# Patient Record
Sex: Female | Born: 1950 | Race: White | Hispanic: No | Smoking: Never smoker
Health system: Southern US, Community
[De-identification: ages and names within clinical notes are randomized; demographics above are authoritative.]

## PROBLEM LIST (undated history)

## (undated) DIAGNOSIS — K222 Esophageal obstruction: Secondary | ICD-10-CM

## (undated) DIAGNOSIS — R1310 Dysphagia, unspecified: Secondary | ICD-10-CM

## (undated) DIAGNOSIS — J45909 Unspecified asthma, uncomplicated: Secondary | ICD-10-CM

## (undated) DIAGNOSIS — J309 Allergic rhinitis, unspecified: Secondary | ICD-10-CM

## (undated) DIAGNOSIS — R41841 Cognitive communication deficit: Secondary | ICD-10-CM

## (undated) DIAGNOSIS — R569 Unspecified convulsions: Secondary | ICD-10-CM

## (undated) HISTORY — DX: Allergic rhinitis, unspecified: J30.9

## (undated) HISTORY — DX: Unspecified asthma, uncomplicated: J45.909

## (undated) HISTORY — PX: CHOLECYSTECTOMY: SHX55

---

## 2018-06-23 DIAGNOSIS — G40909 Epilepsy, unspecified, not intractable, without status epilepticus: Secondary | ICD-10-CM

## 2020-04-25 ENCOUNTER — Emergency Department (HOSPITAL_COMMUNITY): Payer: Medicare Other | Admitting: Anesthesiology

## 2020-04-25 ENCOUNTER — Encounter (HOSPITAL_COMMUNITY)
Admission: EM | Disposition: A | Discharge status: Home / Self Care | Payer: Self-pay | Source: Home / Self Care | Attending: Emergency Medicine

## 2020-04-25 ENCOUNTER — Other Ambulatory Visit: Payer: Self-pay

## 2020-04-25 ENCOUNTER — Emergency Department (HOSPITAL_COMMUNITY)
Admission: EM | Admit: 2020-04-25 | Discharge: 2020-04-25 | Disposition: A | Discharge status: Home / Self Care | Payer: Medicare Other | Attending: Gastroenterology | Admitting: Gastroenterology

## 2020-04-25 ENCOUNTER — Encounter (HOSPITAL_COMMUNITY): Payer: Self-pay

## 2020-04-25 DIAGNOSIS — K219 Gastro-esophageal reflux disease without esophagitis: Secondary | ICD-10-CM | POA: Insufficient documentation

## 2020-04-25 DIAGNOSIS — K222 Esophageal obstruction: Secondary | ICD-10-CM | POA: Diagnosis not present

## 2020-04-25 DIAGNOSIS — Z7951 Long term (current) use of inhaled steroids: Secondary | ICD-10-CM | POA: Diagnosis not present

## 2020-04-25 DIAGNOSIS — T18128A Food in esophagus causing other injury, initial encounter: Secondary | ICD-10-CM | POA: Diagnosis not present

## 2020-04-25 DIAGNOSIS — K2210 Ulcer of esophagus without bleeding: Secondary | ICD-10-CM | POA: Diagnosis not present

## 2020-04-25 DIAGNOSIS — R41841 Cognitive communication deficit: Secondary | ICD-10-CM | POA: Insufficient documentation

## 2020-04-25 DIAGNOSIS — X58XXXA Exposure to other specified factors, initial encounter: Secondary | ICD-10-CM | POA: Insufficient documentation

## 2020-04-25 DIAGNOSIS — Z885 Allergy status to narcotic agent status: Secondary | ICD-10-CM | POA: Diagnosis not present

## 2020-04-25 DIAGNOSIS — Z20822 Contact with and (suspected) exposure to covid-19: Secondary | ICD-10-CM | POA: Insufficient documentation

## 2020-04-25 DIAGNOSIS — R1310 Dysphagia, unspecified: Secondary | ICD-10-CM | POA: Diagnosis present

## 2020-04-25 DIAGNOSIS — Z79899 Other long term (current) drug therapy: Secondary | ICD-10-CM | POA: Insufficient documentation

## 2020-04-25 HISTORY — PX: ESOPHAGOGASTRODUODENOSCOPY (EGD) WITH PROPOFOL: SHX5813

## 2020-04-25 HISTORY — PX: FOREIGN BODY REMOVAL: SHX962

## 2020-04-25 LAB — COMPREHENSIVE METABOLIC PANEL
ALT: 19 U/L (ref 0–44)
AST: 20 U/L (ref 15–41)
Albumin: 4.1 g/dL (ref 3.5–5.0)
Alkaline Phosphatase: 134 U/L — ABNORMAL HIGH (ref 38–126)
Anion gap: 12 (ref 5–15)
BUN: 14 mg/dL (ref 8–23)
CO2: 27 mmol/L (ref 22–32)
Calcium: 9.3 mg/dL (ref 8.9–10.3)
Chloride: 99 mmol/L (ref 98–111)
Creatinine, Ser: 0.7 mg/dL (ref 0.44–1.00)
GFR, Estimated: >60 mL/min (ref >60)
Glucose, Bld: 103 mg/dL — ABNORMAL HIGH (ref 70–99)
Potassium: 3.4 mmol/L — ABNORMAL LOW (ref 3.5–5.1)
Sodium: 138 mmol/L (ref 135–145)
Total Bilirubin: 0.8 mg/dL (ref 0.3–1.2)
Total Protein: 8.3 g/dL — ABNORMAL HIGH (ref 6.5–8.1)

## 2020-04-25 LAB — CBC WITH DIFFERENTIAL/PLATELET
Abs Immature Granulocytes: 0.04 10*3/uL (ref 0.00–0.07)
Basophils Absolute: 0.1 10*3/uL (ref 0.0–0.1)
Basophils Relative: 1 %
Eosinophils Absolute: 0.1 10*3/uL (ref 0.0–0.5)
Eosinophils Relative: 2 %
HCT: 41.4 % (ref 36.0–46.0)
Hemoglobin: 13.8 g/dL (ref 12.0–15.0)
Immature Granulocytes: 1 %
Lymphocytes Relative: 18 %
Lymphs Abs: 1.6 10*3/uL (ref 0.7–4.0)
MCH: 31.6 pg (ref 26.0–34.0)
MCHC: 33.3 g/dL (ref 30.0–36.0)
MCV: 94.7 fL (ref 80.0–100.0)
Monocytes Absolute: 0.7 10*3/uL (ref 0.1–1.0)
Monocytes Relative: 9 %
Neutro Abs: 6.1 10*3/uL (ref 1.7–7.7)
Neutrophils Relative %: 69 %
Platelets: 297 10*3/uL (ref 150–400)
RBC: 4.37 MIL/uL (ref 3.87–5.11)
RDW: 14.3 % (ref 11.5–15.5)
WBC: 8.6 10*3/uL (ref 4.0–10.5)
nRBC: 0 % (ref 0.0–0.2)

## 2020-04-25 LAB — POC SARS CORONAVIRUS 2 AG: SARS Coronavirus 2 Ag: NEGATIVE

## 2020-04-25 SURGERY — ESOPHAGOGASTRODUODENOSCOPY (EGD) WITH PROPOFOL
Anesthesia: General

## 2020-04-25 MED ORDER — SUCCINYLCHOLINE CHLORIDE 200 MG/10ML IV SOSY
PREFILLED_SYRINGE | INTRAVENOUS | Status: DC | PRN
  Administered 2020-04-25: 140 mg via INTRAVENOUS

## 2020-04-25 MED ORDER — PROPOFOL 10 MG/ML IV BOLUS
INTRAVENOUS | Status: DC | PRN
  Administered 2020-04-25: 180 mg via INTRAVENOUS

## 2020-04-25 MED ORDER — SODIUM CHLORIDE 0.9 % IV BOLUS
500.0000 mL | Freq: Once | INTRAVENOUS | Status: AC
  Administered 2020-04-25: 500 mL via INTRAVENOUS

## 2020-04-25 MED ORDER — LACTATED RINGERS IV SOLN: INTRAVENOUS | Status: DC | PRN

## 2020-04-25 MED ORDER — FENTANYL CITRATE (PF) 100 MCG/2ML IJ SOLN
INTRAMUSCULAR | Status: AC
  Filled 2020-04-25: qty 2

## 2020-04-25 MED ORDER — PANTOPRAZOLE SODIUM 40 MG PO TBEC: 40.0000 mg | DELAYED_RELEASE_TABLET | Freq: Two times a day (BID) | ORAL | 0 refills | Status: DC

## 2020-04-25 MED ORDER — LIDOCAINE 2% (20 MG/ML) 5 ML SYRINGE
INTRAMUSCULAR | Status: DC | PRN
  Administered 2020-04-25: 60 mg via INTRAVENOUS

## 2020-04-25 MED ORDER — SODIUM CHLORIDE 0.9 % IV SOLN: INTRAVENOUS | Status: DC

## 2020-04-25 MED ORDER — FENTANYL CITRATE (PF) 100 MCG/2ML IJ SOLN
INTRAMUSCULAR | Status: DC | PRN
  Administered 2020-04-25: 50 ug via INTRAVENOUS

## 2020-04-25 MED ORDER — PROPOFOL 10 MG/ML IV BOLUS
INTRAVENOUS | Status: AC
  Filled 2020-04-25: qty 20

## 2020-04-25 MED ORDER — ACETAMINOPHEN 325 MG PO TABS
650.0000 mg | ORAL_TABLET | Freq: Once | ORAL | Status: AC
  Administered 2020-04-25: 650 mg via ORAL
  Filled 2020-04-25: qty 2

## 2020-04-25 SURGICAL SUPPLY — 14 items

## 2020-04-25 NOTE — Brief Op Note (Signed)
04/25/2020  3:20 PM  PATIENT:  Erica Griffin  70 y.o. female  PRE-OPERATIVE DIAGNOSIS:  food impaction  POST-OPERATIVE DIAGNOSIS:  esophageal ulcer, food bolus  PROCEDURE:  Procedure(s): ESOPHAGOGASTRODUODENOSCOPY (EGD) WITH PROPOFOL (N/A)  SURGEON:  Surgeon(s) and Role:    * Gerad Cornelio, MD - Primary  Findings --------- -Retained pill and small amount of retained food at GE junction. Was removed easily by pushing down into the stomach. There was a large circumferential ulcer at the distal esophagus probably from retained pill for last 2 days. There was also lower esophageal stenosis but I was able to advance scope without any resistance.  Recommendations ------------------------- -Findings discussed with the ED physician -Recommend Protonix 40 mg twice a day for next 2 months and then Protonix 40 mg once a day -She may benefit from repeat EGD in 2 to 3 months to document healing of distal esophageal ulcer  -Avoid NSAIDs -Recommend soft diet. She needs to chew food well and moist food before swallowing -Okay to discharge from GI standpoint. GI will sign off.  Kathi Der MD, FACP 04/25/2020, 3:22 PM  Contact #  [phone]

## 2020-04-25 NOTE — Discharge Instructions (Signed)
-  Recommend Protonix 40 mg twice a day for next 2 months and then Protonix 40 mg once a day.  Prescription given for Protonix, will need maintenance prescription from PCP. -She may benefit from repeat EGD in 2 to 3 months to document healing of distal esophageal ulcer  -Avoid NSAIDs -Recommend soft diet. She needs to chew food well and moist food before swallowing

## 2020-04-25 NOTE — Op Note (Addendum)
[facility] Patient Name: Erica Griffin Procedure Date: 04/25/2020 MRN: [id number] Attending MD: Kathi Der , MD Date of Birth: 05-Feb-1951 CSN: [id number] Age: 70 Admit Type: Emergency Department Procedure:                Upper GI endoscopy Indications:              Dysphagia, Foreign body in the esophagus Providers:                Kathi Der, MD, Clearnce Sorrel, RN, Rosilyn Mings, Technician Referring MD:              Medicines:                Sedation Administered by an Anesthesia Professional Complications:            No immediate complications. Estimated Blood Loss:     Estimated blood loss was minimal. Procedure:                Pre-Anesthesia Assessment:                           - Prior to the procedure, a History and Physical                            was performed, and patient medications and                            allergies were reviewed. The patient's tolerance of                            previous anesthesia was also reviewed. The risks                            and benefits of the procedure and the sedation                            options and risks were discussed with the patient.                            All questions were answered, and informed consent                            was obtained. Prior Anticoagulants: The patient has                            taken no previous anticoagulant or antiplatelet                            agents. ASA Grade Assessment: III - A patient with                            severe systemic disease. After reviewing the risks  and benefits, the patient was deemed in                            satisfactory condition to undergo the procedure.                           After obtaining informed consent, the endoscope was                            passed under direct vision. Throughout the                            procedure, the patient's blood pressure,  pulse, and                            oxygen saturations were monitored continuously. The                            [id number]-H190 ([id number]) Olympus gastroscope was                            introduced through the mouth, and advanced to the                            second part of duodenum. The upper GI endoscopy was                            accomplished without difficulty. The patient                            tolerated the procedure well. Scope In: Scope Out: Findings:      Food and Pill was found at the gastroesophageal junction. Removal of       food and pill was accomplished by gently pushing it down in to the       stomach without any resistance.      One cratered large esophageal ulcer with no bleeding and no stigmata of       recent bleeding was found in the distal esophagus. Most likely from       retained food and pill . ? Pill esophagitis      One benign-appearing, intrinsic moderate (circumferential scarring or       stenosis; an endoscope may pass) stenosis was found at the       gastroesophageal junction. The stenosis was traversed.      No gross lesions were noted in the entire examined stomach.      The cardia and gastric fundus were normal on retroflexion.      The duodenal bulb, first portion of the duodenum and second portion of       the duodenum were normal. Impression:               - Food at the gastroesophageal junction. Removal                            was successful.                           -  Esophageal ulcer with no bleeding and no stigmata                            of recent bleeding.                           - Benign-appearing esophageal stenosis.                           - No gross lesions in the stomach.                           - Normal duodenal bulb, first portion of the                            duodenum and second portion of the duodenum. Moderate Sedation:      Moderate (conscious) sedation was personally administered by an        anesthesia professional. The following parameters were monitored: oxygen       saturation, heart rate, blood pressure, and response to care. Recommendation:           - Patient has a contact number available for                            emergencies. The signs and symptoms of potential                            delayed complications were discussed with the                            patient. Return to normal activities tomorrow.                            Written discharge instructions were provided to the                            patient.                           - Soft diet.                           - Use Protonix (pantoprazole) 40 mg PO BID for 2                            months.                           - Repeat upper endoscopy in 3 months to check                            healing. Procedure Code(s):        --- Professional ---                           [phone], Esophagogastroduodenoscopy, flexible,  transoral; with removal of foreign body(s) Diagnosis Code(s):        --- Professional ---                           H68.088P, Food in esophagus causing other injury,                            initial encounter                           K22.10, Ulcer of esophagus without bleeding                           K22.2, Esophageal obstruction                           R13.10, Dysphagia, unspecified                           T18.108A, Unspecified foreign body in esophagus                            causing other injury, initial encounter CPT copyright 2019 [employer]. All rights reserved. The codes documented in this report are preliminary and upon coder review may  be revised to meet current compliance requirements. Kathi Der, MD Kathi Der, MD 04/25/2020 3:20:11 PM Number of Addenda: 0

## 2020-04-25 NOTE — Transfer of Care (Signed)
Immediate Anesthesia Transfer of Care Note  Patient: Erica Griffin  Procedure(s) Performed: ESOPHAGOGASTRODUODENOSCOPY (EGD) WITH PROPOFOL (N/A )  Patient Location: PACU  Anesthesia Type:General  Level of Consciousness: awake, alert  and oriented  Airway & Oxygen Therapy: Patient Spontanous Breathing and Patient connected to nasal cannula oxygen  Post-op Assessment: Report given to RN and Post -op Vital signs reviewed and stable  Post vital signs: Reviewed and stable  Last Vitals:  Vitals Value Taken Time  BP    Temp    Pulse 93 04/25/20 1525  Resp 25 04/25/20 1525  SpO2 100 % 04/25/20 1525  Vitals shown include unvalidated device data.  Last Pain:  Vitals:   04/25/20 1428  TempSrc: Oral  PainSc: 0-No pain         Complications: No complications documented.

## 2020-04-25 NOTE — ED Provider Notes (Signed)
[facility]-EMERGENCY DEPT Provider Note   CSN: [id number] Arrival date & time: 04/25/20  1053     History Chief Complaint  Patient presents with  . Dysphagia    Erica Griffin is a 70 y.o. female.  70 year old female brought in by EMS from [facility] for difficulty swallowing, concern for possible food bolus impaction.  History of generalized muscle weakness, cognitive communication deficit, GERD without esophagitis, dysphagia. Per records, patient has been unable to swallow or keep her food down for the past 2 days.  Patient feels like there is something stuck in her throat.  Patient states this is never happened before.  Patient denies chest pain, shortness of breath, nausea, vomiting, abdominal pain or other complaints or concerns. History is limited to records sent by facility.         History reviewed. No pertinent past medical history.  There are no problems to display for this patient.    OB History   No obstetric history on file.     History reviewed. No pertinent family history.  Social History   Tobacco Use  . Smoking status: Never Smoker  Vaping Use  . Vaping Use: Never used  Substance Use Topics  . Alcohol use: Never  . Drug use: Never    Home Medications Prior to Admission medications   Medication Sig Start Date End Date Taking? Authorizing Provider  acetaminophen (TYLENOL) 325 MG tablet Take 650 mg by mouth 2 (two) times daily.   Yes [provider]  acetaminophen (TYLENOL) 500 MG tablet Take 1,000 mg by mouth every 8 (eight) hours as needed for mild pain.   Yes [provider]  albuterol (VENTOLIN HFA) 108 (90 Base) MCG/ACT inhaler Inhale 2 puffs into the lungs 2 (two) times daily. 04/04/20  Yes [provider]  atenolol-chlorthalidone (TENORETIC) 50-25 MG tablet Take 1 tablet by mouth daily. 04/22/20  Yes [provider]  azelastine (ASTELIN) 0.1 % nasal spray Place 1 spray into both  nostrils 2 (two) times daily. 03/05/20  Yes [provider]  benzonatate (TESSALON) 100 MG capsule Take 200 mg by mouth 2 (two) times daily. 04/15/20  Yes [provider]  busPIRone (BUSPAR) 10 MG tablet Take 10 mg by mouth 2 (two) times daily. 04/02/20  Yes [provider]  calcium carbonate (TUMS - DOSED IN MG ELEMENTAL CALCIUM) 500 MG chewable tablet Chew 1,000 mg by mouth 2 (two) times daily.   Yes [provider]  Cholecalciferol (VITAMIN D-3) 125 MCG (5000 UT) TABS Take 5,000 Units by mouth daily.   Yes [provider]  dextromethorphan (DELSYM) 30 MG/5ML liquid Take 60 mg by mouth every 12 (twelve) hours as needed for cough.   Yes [provider]  fexofenadine (ALLEGRA) 180 MG tablet Take 180 mg by mouth daily.   Yes [provider]  fluticasone (FLONASE) 50 MCG/ACT nasal spray Place 1 spray into both nostrils daily. 11/06/13  Yes [provider]  hydrOXYzine (ATARAX/VISTARIL) 10 MG tablet Take 10 mg by mouth 2 (two) times daily. 03/27/20  Yes [provider]  lamoTRIgine (LAMICTAL) 150 MG tablet Take 150 mg by mouth daily. 04/19/20  Yes [provider]  lidocaine (LIDODERM) 5 % Place 1 patch onto the skin daily. Remove & Discard patch within 12 hours or as directed by MD  TO BILATERAL THIGH   Yes [provider]  LUMIGAN 0.01 % SOLN Place 1 drop into both eyes daily. 04/04/20  Yes [provider]  montelukast (SINGULAIR) 10 MG tablet Take 10 mg by mouth daily. 04/04/20  Yes [provider]  orlistat (ALLI) 60 MG capsule Take 60 mg by mouth every other day.   Yes [provider]  oxybutynin (DITROPAN-XL) 5 MG 24 hr tablet Take 5 mg by mouth daily. 04/16/20  Yes [provider]  pantoprazole (PROTONIX) 40 MG tablet Take 1 tablet (40 mg total) by mouth 2 (two) times daily. 04/25/20 06/24/20 Yes Jeannie Fend, PA-C  polyvinyl alcohol (LIQUIFILM TEARS) 1.4 % ophthalmic  solution Place 1 drop into both eyes 4 (four) times daily.   Yes [provider]  primidone (MYSOLINE) 250 MG tablet Take 125-250 mg by mouth See admin instructions. 125mg  in the morning 250mg  at bedtime 04/08/20  Yes [provider]  topiramate (TOPAMAX) 50 MG tablet Take 50 mg by mouth daily. 04/02/20  Yes [provider]    Allergies    Codeine  Review of Systems   Review of Systems  Constitutional: Negative for fever.  Respiratory: Negative for shortness of breath.   Cardiovascular: Negative for chest pain.  Gastrointestinal: Negative for abdominal pain, nausea and vomiting.  Genitourinary: Negative for difficulty urinating.  Musculoskeletal: Negative for neck pain.  Neurological: Negative for weakness.    Physical Exam Updated Vital Signs BP (!) 148/99   Pulse 96   Temp 98.5 F (36.9 C)   Resp 19   Ht 5\' 2"  (1.575 m)   Wt 120.2 kg   SpO2 97%   BMI 48.47 kg/m   Physical Exam Vitals and nursing note reviewed.  Constitutional:      General: She is not in acute distress.    Appearance: She is well-developed and well-nourished. She is obese. She is not diaphoretic.  HENT:     Head: Normocephalic and atraumatic.     Mouth/Throat:     Mouth: Mucous membranes are moist.     Pharynx: Oropharynx is clear.  Cardiovascular:     Rate and Rhythm: Normal rate and regular rhythm.     Heart sounds: Normal heart sounds.  Pulmonary:     Effort: Pulmonary effort is normal.     Breath sounds: Normal breath sounds.  Abdominal:     Palpations: Abdomen is soft.     Tenderness: There is no abdominal tenderness.  Musculoskeletal:     Cervical back: Neck supple.     Right lower leg: No edema.     Left lower leg: No edema.  Skin:    General: Skin is warm and dry.     Findings: No erythema or rash.  Neurological:     Mental Status: She is alert. Mental status is at baseline.     Motor: No weakness.  Psychiatric:        Mood and Affect: Mood and affect  normal.        Behavior: Behavior normal.     ED Results / Procedures / Treatments   Labs (all labs ordered are listed, but only abnormal results are displayed) Labs Reviewed  COMPREHENSIVE METABOLIC PANEL - Abnormal; Notable for the following components:      Result Value   Potassium 3.4 (*)    Glucose, Bld 103 (*)    Total Protein 8.3 (*)    Alkaline Phosphatase 134 (*)    All other components within normal limits  CBC WITH DIFFERENTIAL/PLATELET  POC SARS CORONAVIRUS 2 AG -  ED  POC SARS CORONAVIRUS 2 AG    EKG None  Radiology No results found.  Procedures Procedures   Medications Ordered in ED Medications  sodium chloride 0.9 % bolus 500 mL (0 mLs Intravenous Stopped 04/25/20 1328)    ED Course  I have reviewed the triage vital signs and the nursing notes.  Pertinent labs & imaging results that were available during my care of the patient were reviewed by me and considered in my medical decision making (see chart for details).  Clinical Course as of 04/25/20 1659  Thu Apr 25, 2020  3549 70 year old female sent for inability to keep food down for the past 2 days per history from facility/EMS. Patient reports feeling something is stuck in her throat, denies history of similar in the past. On exam, patient is awake, pleasant.  Attempted to have patient take a small sip of water, she reports it feels like it is stuck and within a few seconds begins coughing and spitting up water and saliva. [LM]  1124 Case discussed with Dr. Anitra Lauth, ER attending, plan is to discuss with GI. [LM]  1156 Discussed with [name] with GI who will review chart.  [LM]  1303 Call from [name] with GI, patient did have impacted food bolus and pill, has an ulcer in her esophagus, stricture unable to be dilated due to the ulcer.  Recommends Protonix 40 mg twice daily x2 months. [LM]  1527 Patient is tolerating soft foods, discussed results with her.  Plan is for discharge back to  facility. [LM]    Clinical Course User Index [LM] Alden Hipp   MDM Rules/Calculators/A&P                          Final Clinical Impression(s) / ED Diagnoses Final diagnoses:  Esophageal obstruction due to food impaction  Ulcer of esophagus without bleeding  Esophageal stricture    Rx / DC Orders ED Discharge Orders         Ordered    pantoprazole (PROTONIX) 40 MG tablet  2 times daily        04/25/20 1531           Jeannie Fend, PA-C 04/25/20 1659    Gwyneth Sprout, MD 04/29/20 1730

## 2020-04-25 NOTE — ED Notes (Addendum)
Pt provided with apple sauce and water 

## 2020-04-25 NOTE — Anesthesia Procedure Notes (Signed)
Procedure Name: Intubation Date/Time: 04/25/2020 3:00 PM Performed by: Lanier Millon D, CRNA Pre-anesthesia Checklist: Patient identified, Emergency Drugs available, Suction available and Patient being monitored Patient Re-evaluated:Patient Re-evaluated prior to induction Oxygen Delivery Method: Circle system utilized Preoxygenation: Pre-oxygenation with 100% oxygen Induction Type: IV induction, Rapid sequence and Cricoid Pressure applied Laryngoscope Size: Mac and 3 Tube type: Oral Number of attempts: 1 Airway Equipment and Method: Stylet Placement Confirmation: ETT inserted through vocal cords under direct vision,  positive ETCO2 and breath sounds checked- equal and bilateral Secured at: 21 cm Tube secured with: Tape Dental Injury: Teeth and Oropharynx as per pre-operative assessment

## 2020-04-25 NOTE — ED Notes (Signed)
Pericare performed prior to transport with [facility] because pt had a BM

## 2020-04-25 NOTE — ED Notes (Addendum)
Pt had a liquid-like bowel movement. Pericare performed, linen and brief changed. Barrier cream applied to perineal area

## 2020-04-25 NOTE — ED Notes (Signed)
Called report to [facility] care and discussed d/c instructions with RN there d/t pt's intellectual disability. RN denied any questions. No further care needed at this time

## 2020-04-25 NOTE — Consult Note (Addendum)
Referring Provider:  ED Primary Care Physician:  [name], MD Primary Gastroenterologist:  Gentry Fitz   Reason for Consultation:  Dysphagia, food impaction   HPI: Erica Griffin is a 70 y.o. female protein by EMS from [facility] for possible food impaction.  Patient with history of cognitive deficit, generalized muscle weakness and GERD.  According to staff at the facility, patient is not able to eat or drink anything for last 2 days.  ER physician tried giving patient water which also came back.  The patient denies previous similar symptoms.  She said that she might have EGD before but does not recall the findings.  Limited documentations available to review the history.  Past Surgical History:  Procedure Laterality Date  . CHOLECYSTECTOMY      Prior to Admission medications   Medication Sig Start Date End Date Taking? Authorizing Provider  acetaminophen (TYLENOL) 325 MG tablet Take 650 mg by mouth 2 (two) times daily.   Yes [provider]  acetaminophen (TYLENOL) 500 MG tablet Take 1,000 mg by mouth every 8 (eight) hours as needed for mild pain.   Yes [provider]  albuterol (VENTOLIN HFA) 108 (90 Base) MCG/ACT inhaler Inhale 2 puffs into the lungs 2 (two) times daily. 04/04/20  Yes [provider]  atenolol-chlorthalidone (TENORETIC) 50-25 MG tablet Take 1 tablet by mouth daily. 04/22/20  Yes [provider]  azelastine (ASTELIN) 0.1 % nasal spray Place 1 spray into both nostrils 2 (two) times daily. 03/05/20  Yes [provider]  benzonatate (TESSALON) 100 MG capsule Take 200 mg by mouth 2 (two) times daily. 04/15/20  Yes [provider]  busPIRone (BUSPAR) 10 MG tablet Take 10 mg by mouth 2 (two) times daily. 04/02/20  Yes [provider]  calcium carbonate (TUMS - DOSED IN MG ELEMENTAL CALCIUM) 500 MG chewable tablet Chew 1,000 mg by mouth 2 (two) times daily.   Yes [provider]   Cholecalciferol (VITAMIN D-3) 125 MCG (5000 UT) TABS Take 5,000 Units by mouth daily.   Yes [provider]  dextromethorphan (DELSYM) 30 MG/5ML liquid Take 60 mg by mouth every 12 (twelve) hours as needed for cough.   Yes [provider]  fexofenadine (ALLEGRA) 180 MG tablet Take 180 mg by mouth daily.   Yes [provider]  fluticasone (FLONASE) 50 MCG/ACT nasal spray Place 1 spray into both nostrils daily. 11/06/13  Yes [provider]  hydrOXYzine (ATARAX/VISTARIL) 10 MG tablet Take 10 mg by mouth 2 (two) times daily. 03/27/20  Yes [provider]  lamoTRIgine (LAMICTAL) 150 MG tablet Take 150 mg by mouth daily. 04/19/20  Yes [provider]  lidocaine (LIDODERM) 5 % Place 1 patch onto the skin daily. Remove & Discard patch within 12 hours or as directed by MD  TO BILATERAL THIGH   Yes [provider]  LUMIGAN 0.01 % SOLN Place 1 drop into both eyes daily. 04/04/20  Yes [provider]  montelukast (SINGULAIR) 10 MG tablet Take 10 mg by mouth daily. 04/04/20  Yes [provider]  orlistat (ALLI) 60 MG capsule Take 60 mg by mouth every other day.   Yes [provider]  oxybutynin (DITROPAN-XL) 5 MG 24 hr tablet Take 5 mg by mouth daily. 04/16/20  Yes [provider]  polyvinyl alcohol (LIQUIFILM TEARS) 1.4 % ophthalmic solution Place 1 drop into both eyes 4 (four) times daily.   Yes [provider]  primidone (MYSOLINE) 250 MG  tablet Take 125-250 mg by mouth See admin instructions. 125mg  in the morning 250mg  at bedtime 04/08/20  Yes [provider]  topiramate (TOPAMAX) 50 MG tablet Take 50 mg by mouth daily. 04/02/20  Yes [provider]    Scheduled Meds: Continuous Infusions: . sodium chloride     PRN Meds:.  Allergies as of 04/25/2020 - Review Complete 04/25/2020  Allergen Reaction Noted  . Codeine Nausea And Vomiting 02/06/2014    History reviewed. No pertinent  family history.  Social History   Socioeconomic History  . Marital status: Divorced    Spouse name: Not on file  . Number of children: Not on file  . Years of education: Not on file  . Highest education level: Not on file  Occupational History  . Not on file  Tobacco Use  . Smoking status: Never Smoker  . Smokeless tobacco: Not on file  Vaping Use  . Vaping Use: Never used  Substance and Sexual Activity  . Alcohol use: Never  . Drug use: Never  . Sexual activity: Not on file  Other Topics Concern  . Not on file  Social History Narrative  . Not on file   Social Determinants of Health   Financial Resource Strain: Not on file  Food Insecurity: Not on file  Transportation Needs: Not on file  Physical Activity: Not on file  Stress: Not on file  Social Connections: Not on file  Intimate Partner Violence: Not on file    Review of Systems: Limited review of system as per HPI.  As patient is not able to give detailed history  Physical Exam: Vital signs: Vitals:   04/25/20 1345 04/25/20 1428  BP: 139/86 (!) 155/77  Pulse: 85 90  Resp: (!) 23 20  Temp:  98.1 F (36.7 C)  SpO2: 99% 96%     General: Obese patient, not in acute distress Lungs:  Clear throughout to auscultation.   No wheezes, crackles, or rhonchi. No acute distress. Heart:  Regular rate and rhythm; no murmurs, clicks, rubs,  or gallops. Abdomen:  Rectal:  Deferred  GI:  Lab Results: Recent Labs    04/25/20 1210  WBC 8.6  HGB 13.8  HCT 41.4  PLT 297   BMET Recent Labs    04/25/20 1210  NA 138  K 3.4*  CL 99  CO2 27  GLUCOSE 103*  BUN 14  CREATININE 0.70  CALCIUM 9.3   LFT Recent Labs    04/25/20 1210  PROT 8.3*  ALBUMIN 4.1  AST 20  ALT 19  ALKPHOS 134*  BILITOT 0.8   PT/INR No results for input(s): LABPROT, INR in the last 72 hours.   Studies/Results: No results found.  Impression/Plan:  Possible food  impaction  Recommendations --------------------------- -Proceed with EGD today.  Risks (bleeding, infection, bowel perforation that could require surgery, sedation-related changes in cardiopulmonary systems), benefits (identification and possible treatment of source of symptoms, exclusion of certain causes of symptoms), and alternatives (watchful waiting, radiographic imaging studies, empiric medical treatment)  were explained to patient/family in detail and patient wishes to proceed.    LOS: 0 days   06/23/20  MD, FACP 04/25/2020, 2:35 PM  Contact #  [phone]

## 2020-04-25 NOTE — ED Triage Notes (Signed)
Pt came from [facility].  C/c unable to tolerate PO intake for past 2 days. Pt states "It feels like something is stuck". Pt unable to keep water down when PO trial performed. Facility called EMS for possible GI consult d/t pt not eating

## 2020-04-25 NOTE — Anesthesia Preprocedure Evaluation (Addendum)
Anesthesia Evaluation  Patient identified by MRN, date of birth, ID band Patient awake    Reviewed: Allergy & Precautions, NPO status , Patient's Chart, lab work & pertinent test results  History of Anesthesia Complications Negative for: history of anesthetic complications  Airway Mallampati: II  TM Distance: >3 FB Neck ROM: Full    Dental  (+) Edentulous Upper, Missing   Pulmonary neg pulmonary ROS,    Pulmonary exam normal        Cardiovascular negative cardio ROS Normal cardiovascular exam     Neuro/Psych Seizures -,  Cognitive disability- lives in group home negative psych ROS   GI/Hepatic negative GI ROS, Neg liver ROS,   Endo/Other  Morbid obesity  Renal/GU negative Renal ROS  negative genitourinary   Musculoskeletal negative musculoskeletal ROS (+)   Abdominal   Peds  Hematology negative hematology ROS (+)   Anesthesia Other Findings   Reproductive/Obstetrics                             Anesthesia Physical Anesthesia Plan  ASA: III and emergent  Anesthesia Plan: General   Post-op Pain Management:    Induction: Intravenous, Rapid sequence and Cricoid pressure planned  PONV Risk Score and Plan: 3 and Ondansetron, Dexamethasone, Treatment may vary due to age or medical condition and Midazolam  Airway Management Planned: Oral ETT  Additional Equipment: None  Intra-op Plan:   Post-operative Plan: Extubation in OR  Informed Consent: I have reviewed the patients History and Physical, chart, labs and discussed the procedure including the risks, benefits and alternatives for the proposed anesthesia with the patient or authorized representative who has indicated his/her understanding and acceptance.     Dental advisory given  Plan Discussed with:   Anesthesia Plan Comments:         Anesthesia Quick Evaluation

## 2020-04-25 NOTE — ED Notes (Signed)
Called [facility] for transport.  

## 2020-04-25 NOTE — ED Notes (Signed)
POC COVID (-) Neg

## 2020-04-25 NOTE — Anesthesia Postprocedure Evaluation (Signed)
Anesthesia Post Note  Patient: Erica Griffin  Procedure(s) Performed: ESOPHAGOGASTRODUODENOSCOPY (EGD) WITH PROPOFOL (N/A )     Patient location during evaluation: Endoscopy Anesthesia Type: General Level of consciousness: awake and alert Pain management: pain level controlled Vital Signs Assessment: post-procedure vital signs reviewed and stable Respiratory status: spontaneous breathing, nonlabored ventilation and respiratory function stable Cardiovascular status: blood pressure returned to baseline and stable Postop Assessment: no apparent nausea or vomiting Anesthetic complications: no   No complications documented.  Last Vitals:  Vitals:   04/25/20 1700 04/25/20 1800  BP: (!) 143/95 (!) 143/67  Pulse: 97 93  Resp: (!) 25 18  Temp:    SpO2: 99% 95%    Last Pain:  Vitals:   04/25/20 1525  TempSrc:   PainSc: 0-No pain                 Lucretia Kern

## 2020-04-26 ENCOUNTER — Encounter (HOSPITAL_COMMUNITY): Payer: Self-pay | Admitting: Gastroenterology

## 2020-05-10 ENCOUNTER — Other Ambulatory Visit: Payer: Self-pay

## 2020-05-10 ENCOUNTER — Emergency Department (HOSPITAL_COMMUNITY): Payer: Medicare Other

## 2020-05-10 ENCOUNTER — Emergency Department (HOSPITAL_COMMUNITY)
Admission: EM | Admit: 2020-05-10 | Discharge: 2020-05-11 | Disposition: A | Discharge status: Home / Self Care | Payer: Medicare Other | Attending: Emergency Medicine | Admitting: Emergency Medicine

## 2020-05-10 ENCOUNTER — Encounter (HOSPITAL_COMMUNITY): Payer: Self-pay

## 2020-05-10 DIAGNOSIS — T17320A Food in larynx causing asphyxiation, initial encounter: Secondary | ICD-10-CM | POA: Diagnosis present

## 2020-05-10 DIAGNOSIS — T17320D Food in larynx causing asphyxiation, subsequent encounter: Secondary | ICD-10-CM

## 2020-05-10 DIAGNOSIS — X58XXXA Exposure to other specified factors, initial encounter: Secondary | ICD-10-CM | POA: Insufficient documentation

## 2020-05-10 MED ORDER — ACETAMINOPHEN 325 MG PO TABS
325.0000 mg | ORAL_TABLET | Freq: Once | ORAL | Status: AC
  Administered 2020-05-10: 325 mg via ORAL
  Filled 2020-05-10: qty 1

## 2020-05-10 NOTE — ED Triage Notes (Signed)
Pt BIB EMS from [facility]. Pt has hx of choking. EMS reports pt has laceration in her esophagus and pt refuses to adhere to liquid diet. Pt had chicken today, starting to choke, vomited chicken. EMS reports pt had some coughing during ride but no choking. Pt has cognitive communication deficit. Pt A&Ox4.

## 2020-05-10 NOTE — ED Notes (Signed)
Pt passed swallow screen, no drooling, coughing. Pt did not experience any difficulties.

## 2020-05-10 NOTE — ED Provider Notes (Signed)
[facility]-EMERGENCY DEPT Provider Note   CSN: [id number] Arrival date & time: 05/10/20  1311     History Chief Complaint  Patient presents with  . Choking Episode    Erica CanDebra L Griffin is a 70 y.o. female with past medical history of generalized muscle weakness, cognitive communication deficit, GERD without esophagitis, dysphagia with food bolus impactions that presents the emergency department today via EMS from [facility] with choking episode.    Patient had chicken today, started to choke and vomited on chicken.  EMS reports that she had some coughing during the ride but no choking.  Patient coming in today stating that she was supposed to have her chicken chopped up for her properly, however they did not cut up her chicken as well as she would have liked, she ate a piece of chicken and started choking on this.  Apparently patient supposed be on a liquid diet to recurrent episodes of choking.  Patient is hard to understand due to cognitive communication deficit.  States that she thinks that she spit up the entire piece of chicken, however states that she is unsure if she has any chicken stuck now.  Has been able to swallow, coughing has resolved.  Denies any vomiting.  States that her throat feels sore. 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  He recommended 40 mg of Protonix twice daily for the next 2 months, patient has been tolerant with this.    HPI     History reviewed. No pertinent past medical history.  There are no problems to display for this patient.   Past Surgical History:  Procedure Laterality Date  . CHOLECYSTECTOMY    . ESOPHAGOGASTRODUODENOSCOPY (EGD) WITH PROPOFOL N/A 04/25/2020   Procedure: ESOPHAGOGASTRODUODENOSCOPY  (EGD) WITH PROPOFOL;  Surgeon: Kathi DerBrahmbhatt, Parag, MD;  Location: [facility] ENDOSCOPY;  Service: Gastroenterology;  Laterality: N/A;  . FOREIGN BODY REMOVAL  04/25/2020   Procedure: FOREIGN BODY REMOVAL;  Surgeon: Kathi DerBrahmbhatt, Parag, MD;  Location: [facility] ENDOSCOPY;  Service: Gastroenterology;;     OB History   No obstetric history on file.     History reviewed. No pertinent family history.  Social History   Tobacco Use  . Smoking status: Never Smoker  Vaping Use  . Vaping Use: Never used  Substance Use Topics  . Alcohol use: Never  . Drug use: Never    Home Medications Prior to Admission medications   Medication Sig Start Date End Date Taking? Authorizing Provider  acetaminophen (TYLENOL) 325 MG tablet Take 650 mg by mouth 2 (two) times daily.    [provider]  acetaminophen (TYLENOL) 500 MG tablet Take 1,000 mg by mouth every 8 (eight) hours as needed for mild pain.    [provider]  albuterol (VENTOLIN HFA) 108 (90 Base) MCG/ACT inhaler Inhale 2 puffs into the lungs 2 (two) times daily. 04/04/20   [provider]  atenolol-chlorthalidone (TENORETIC) 50-25 MG tablet Take 1 tablet by mouth daily. 04/22/20   [provider]  azelastine (ASTELIN) 0.1 % nasal spray Place 1 spray into both nostrils 2 (two) times daily. 03/05/20   [provider]  benzonatate (TESSALON) 100 MG capsule Take 200 mg by mouth 2 (two) times daily. 04/15/20   [provider]  busPIRone (  BUSPAR) 10 MG tablet Take 10 mg by mouth 2 (two) times daily. 04/02/20   [provider]  calcium carbonate (TUMS - DOSED IN MG ELEMENTAL CALCIUM) 500 MG chewable tablet Chew 1,000 mg by mouth 2 (two) times daily.    [provider]  Cholecalciferol (VITAMIN D-3) 125 MCG (5000 UT) TABS Take 5,000 Units by mouth daily.    [provider]  dextromethorphan (DELSYM) 30 MG/5ML liquid Take 60 mg by mouth every 12 (twelve) hours as needed for cough.    [provider]  fexofenadine (ALLEGRA) 180 MG tablet Take 180 mg by mouth daily.    [provider]  fluticasone (FLONASE) 50 MCG/ACT nasal spray Place 1 spray into both nostrils daily. 11/06/13   [provider]  hydrOXYzine (ATARAX/VISTARIL) 10 MG tablet Take 10 mg by mouth 2 (two) times daily. 03/27/20   [provider]  lamoTRIgine (LAMICTAL) 150 MG tablet Take 150 mg by mouth daily. 04/19/20   [provider]  lidocaine (LIDODERM) 5 % Place 1 patch onto the skin daily. Remove & Discard patch within 12 hours or as directed by MD  TO BILATERAL THIGH    [provider]  LUMIGAN 0.01 % SOLN Place 1 drop into both eyes daily. 04/04/20   [provider]  montelukast (SINGULAIR) 10 MG tablet Take 10 mg by mouth daily. 04/04/20   [provider]  orlistat (ALLI) 60 MG capsule Take 60 mg by mouth every other day.    [provider]  oxybutynin (DITROPAN-XL) 5 MG 24 hr tablet Take 5 mg by mouth daily. 04/16/20   [provider]  pantoprazole (PROTONIX) 40 MG tablet Take 1 tablet (40 mg total) by mouth 2 (two) times daily. 04/25/20 06/24/20  Jeannie Fend, PA-C  polyvinyl alcohol (LIQUIFILM TEARS) 1.4 % ophthalmic solution Place 1 drop into both eyes 4 (four) times daily.    [provider]  primidone (MYSOLINE) 250 MG tablet Take 125-250 mg by mouth See admin instructions. 125mg  in the morning 250mg  at bedtime 04/08/20   [provider]  topiramate (TOPAMAX) 50 MG tablet Take 50 mg by mouth daily. 04/02/20   [provider]    Allergies    Codeine  Review of Systems   Review of Systems  Constitutional: Negative for chills, diaphoresis, fatigue and fever.  HENT: Negative for congestion, sore throat and trouble swallowing.   Eyes: Negative for pain and visual disturbance.  Respiratory: Negative for cough, shortness of breath and wheezing.   Cardiovascular: Negative for chest pain, palpitations and  leg swelling.  Gastrointestinal: Negative for abdominal distention, abdominal pain, diarrhea, nausea and vomiting.  Genitourinary: Negative for difficulty urinating.  Musculoskeletal: Negative for back pain, neck pain and neck stiffness.  Skin: Negative for pallor.  Neurological: Negative for dizziness, speech difficulty, weakness and headaches.  Psychiatric/Behavioral: Negative for confusion.    Physical Exam Updated Vital Signs BP (!) 107/54   Pulse 67   Temp 98.3 F (36.8 C) (Oral)   Resp 18   Ht 5\' 8"  (1.727 m)   Wt 108.9 kg   SpO2 99%   BMI 36.49 kg/m   Physical Exam Constitutional:      General: She is not in acute distress.    Appearance: Normal appearance. She is not ill-appearing, toxic-appearing or diaphoretic.  HENT:     Mouth/Throat:     Mouth: Mucous membranes are moist.     Pharynx: Oropharynx is clear.  Eyes:  General: No scleral icterus.    Extraocular Movements: Extraocular movements intact.     Pupils: Pupils are equal, round, and reactive to light.  Cardiovascular:     Rate and Rhythm: Normal rate and regular rhythm.     Pulses: Normal pulses.     Heart sounds: Normal heart sounds.  Pulmonary:     Effort: Pulmonary effort is normal. No respiratory distress.     Breath sounds: Normal breath sounds. No stridor. No wheezing, rhonchi or rales.  Chest:     Chest wall: No tenderness.  Abdominal:     General: Abdomen is flat. There is no distension.     Palpations: Abdomen is soft.     Tenderness: There is no abdominal tenderness. There is no guarding or rebound.  Musculoskeletal:        General: No swelling or tenderness. Normal range of motion.     Cervical back: Normal range of motion and neck supple. No rigidity.     Right lower leg: No edema.     Left lower leg: No edema.  Skin:    General: Skin is warm and dry.     Capillary Refill: Capillary refill takes less than 2 seconds.     Coloration: Skin is not pale.  Neurological:     General: No  focal deficit present.     Mental Status: She is alert and oriented to person, place, and time.  Psychiatric:        Mood and Affect: Mood normal.        Behavior: Behavior normal.     ED Results / Procedures / Treatments   Labs (all labs ordered are listed, but only abnormal results are displayed) Labs Reviewed - No data to display  EKG None  Radiology DG Chest 2 View  Result Date: 05/10/2020 CLINICAL DATA:  Choking today. Large ulcer in the distal esophagus on EGD 04/25/2020. EXAM: CHEST - 2 VIEW COMPARISON:  None. FINDINGS: Lung volumes are low but the lungs are clear. No pneumothorax or pneumomediastinum. Heart size is upper normal. No acute or focal bony abnormality. IMPRESSION: No acute disease. Electronically Signed   By: Drusilla Kanner M.D.   On: 05/10/2020 14:55    Procedures Procedures   Medications Ordered in ED Medications - No data to display  ED Course  I have reviewed the triage vital signs and the nursing notes.  Pertinent labs & imaging results that were available during my care of the patient were reviewed by me and considered in my medical decision making (see chart for details).  Clinical Course as of 05/10/20 1520  Fri May 10, 2020  1419 I was directly involved in this patients medical care.  [JH]    Clinical Course User Index [JH] Cheryll Cockayne, MD   MDM Rules/Calculators/A&P                         LILLIE PORTNER is a 70 y.o. female with past medical history of generalized muscle weakness, cognitive communication deficit, GERD without esophagitis, dysphagia with food bolus impactions that presents the emergency department today via EMS from [facility] with choking episode.  Patient coughing with EMS, however not coughing here.  Patient passed swallow screen without difficulty.  No drooling or pain with this.  States that she does feel better.  Plain films are negative.  Do not suspect that patient has aspiration, coughing has  resolved.  Patient has been observed  for over 2 hours, no coughing, has been tolerating p.o.  Patient to be discharged back to facility, strict precautions given.  Patient will follow up with her PCP/GI doctor.  Patient  Agreeable with plan.    Doubt need for further emergent work up at this time. I explained the diagnosis and have given explicit precautions to return to the ER including for any other new or worsening symptoms. The patient understands and accepts the medical plan as it's been dictated and I have answered their questions. Discharge instructions concerning home care and prescriptions have been given. The patient is STABLE and is discharged to home in good condition.  I discussed this case with my attending physician who cosigned this note including patient's presenting symptoms, physical exam, and planned diagnostics and interventions. Attending physician stated agreement with plan or made changes to plan which were implemented.   Final Clinical Impression(s) / ED Diagnoses Final diagnoses:  Choking due to food in larynx, subsequent encounter    Rx / DC Orders ED Discharge Orders    None       Farrel Gordon, PA-C 05/10/20 1524    Cheryll Cockayne, MD 05/15/20 2253

## 2020-05-10 NOTE — Discharge Instructions (Addendum)
You are seen today for choking, your chest x-ray was clear.  I want you to follow-up with your primary care doctor, make sure to stick to soft or bite-size foods.  Continue to take your Protonix as prescribed.  If you have any fevers or you are unable to swallow your foods properly, drooling or if you have any new or worsening concerning symptoms please go back to the emergency department. Contact a health care provider if: You have trouble swallowing food. You continue to have drooling after choking. You have persistent chest pain after choking. Get help right away if: You have problems breathing after choking stops. You are confused, persistently drowsy, or have lost consciousness at any point. You were given CPR.

## 2020-06-10 ENCOUNTER — Other Ambulatory Visit: Payer: Self-pay | Admitting: Gastroenterology

## 2020-06-20 ENCOUNTER — Other Ambulatory Visit: Payer: Self-pay

## 2020-06-20 ENCOUNTER — Encounter (HOSPITAL_COMMUNITY): Payer: Self-pay | Admitting: Emergency Medicine

## 2020-06-20 ENCOUNTER — Emergency Department (HOSPITAL_COMMUNITY): Payer: Medicare Other

## 2020-06-20 ENCOUNTER — Emergency Department (HOSPITAL_COMMUNITY)
Admission: EM | Admit: 2020-06-20 | Discharge: 2020-06-20 | Disposition: A | Discharge status: Home / Self Care | Payer: Medicare Other | Attending: Emergency Medicine | Admitting: Emergency Medicine

## 2020-06-20 DIAGNOSIS — X58XXXA Exposure to other specified factors, initial encounter: Secondary | ICD-10-CM | POA: Insufficient documentation

## 2020-06-20 DIAGNOSIS — R1110 Vomiting, unspecified: Secondary | ICD-10-CM | POA: Insufficient documentation

## 2020-06-20 DIAGNOSIS — T17320A Food in larynx causing asphyxiation, initial encounter: Secondary | ICD-10-CM | POA: Diagnosis present

## 2020-06-20 NOTE — ED Provider Notes (Signed)
[facility]-EMERGENCY DEPT Provider Note   CSN: [id number] Arrival date & time: 06/20/20  1314     History Chief Complaint  Patient presents with  . Choking    Erica Griffin is a 70 y.o. female with a past medical history of generalized muscle weakness, cognitive communication deficit, GERD without esophagitis, dysphagia with history of food bolus impactions presenting to the ED for possible choking episode.  States that she was eating a black-eyed pea when she felt that it got stuck in her throat.  She tried to drink water but immediately vomited afterwards.  EMS states that she had a similar episode with vomiting in the EMS truck.  She states that this is happened to her in the past with pieces of meat.  She is scheduled for an EGD at the end of the month.  EGD done when patient presented for possible food impaction showed ulcer and esophagitis with strictures but was unable to dilate the esophagus due to the ulcer.  Started on Protonix at that time.  HPI     History reviewed. No pertinent past medical history.  There are no problems to display for this patient.   Past Surgical History:  Procedure Laterality Date  . CHOLECYSTECTOMY    . ESOPHAGOGASTRODUODENOSCOPY (EGD) WITH PROPOFOL N/A 04/25/2020   Procedure: ESOPHAGOGASTRODUODENOSCOPY (EGD) WITH PROPOFOL;  Surgeon: Kathi Der, MD;  Location: [facility];  Service: Gastroenterology;  Laterality: N/A;  . FOREIGN BODY REMOVAL  04/25/2020   Procedure: FOREIGN BODY REMOVAL;  Surgeon: Kathi Der, MD;  Location: [facility];  Service: Gastroenterology;;     OB History   No obstetric history on file.     No family history on file.  Social History   Tobacco Use  . Smoking status: Never Smoker  Vaping Use  . Vaping Use: Never used  Substance Use Topics  . Alcohol use: Never  . Drug use: Never    Home Medications Prior to Admission medications   Medication Sig Start Date End Date  Taking? Authorizing Provider  acetaminophen (TYLENOL) 325 MG tablet Take 650 mg by mouth 2 (two) times daily.   Yes [provider]  acetaminophen (TYLENOL) 500 MG tablet Take 1,000 mg by mouth every 8 (eight) hours as needed for mild pain.   Yes [provider]  albuterol (VENTOLIN HFA) 108 (90 Base) MCG/ACT inhaler Inhale 2 puffs into the lungs 2 (two) times daily. 04/04/20  Yes [provider]  atenolol-chlorthalidone (TENORETIC) 100-25 MG tablet Take 0.5 tablets by mouth daily. 06/14/20  Yes [provider]  azelastine (ASTELIN) 0.1 % nasal spray Place 1 spray into both nostrils 2 (two) times daily. 03/05/20  Yes [provider]  busPIRone (BUSPAR) 10 MG tablet Take 10 mg by mouth 2 (two) times daily. 04/02/20  Yes [provider]  calcium carbonate (TUMS - DOSED IN MG ELEMENTAL CALCIUM) 500 MG chewable tablet Chew 1,000 mg by mouth 2 (two) times daily.   Yes [provider]  Cholecalciferol (VITAMIN D-3) 125 MCG (5000 UT) TABS Take 5,000 Units by mouth daily.   Yes [provider]  dextromethorphan (DELSYM) 30 MG/5ML liquid Take 60 mg by mouth every 12 (twelve) hours as needed for cough.   Yes [provider]  famotidine (PEPCID) 20 MG tablet Take 20 mg by mouth daily. 06/06/20  Yes [provider]  fexofenadine (ALLEGRA) 180 MG tablet Take 180 mg by mouth daily.   Yes [provider]  fluticasone (FLONASE) 50  MCG/ACT nasal spray Place 1 spray into both nostrils daily. 11/06/13  Yes [provider]  guaiFENesin (MUCINEX) 600 MG 12 hr tablet Take 600 mg by mouth 2 (two) times daily.   Yes [provider]  hydrOXYzine (ATARAX/VISTARIL) 10 MG tablet Take 10 mg by mouth 2 (two) times daily. 03/27/20  Yes [provider]  lamoTRIgine (LAMICTAL) 150 MG tablet Take 150 mg by mouth daily. 04/19/20  Yes [provider]  lidocaine (LIDODERM) 5 % Place 1 patch onto the skin daily.  Remove & Discard patch within 12 hours or as directed by MD  TO BILATERAL THIGH   Yes [provider]  LUMIGAN 0.01 % SOLN Place 1 drop into both eyes daily. 04/04/20  Yes [provider]  montelukast (SINGULAIR) 10 MG tablet Take 10 mg by mouth daily. 04/04/20  Yes [provider]  Multiple Vitamin (MULTIVITAMIN WITH MINERALS) TABS tablet Take 1 tablet by mouth daily.   Yes [provider]  oxybutynin (DITROPAN-XL) 5 MG 24 hr tablet Take 5 mg by mouth daily. 04/16/20  Yes [provider]  pantoprazole (PROTONIX) 40 MG tablet Take 1 tablet (40 mg total) by mouth 2 (two) times daily. 04/25/20 06/24/20 Yes Jeannie FendMurphy, Laura A, PA-C  polyvinyl alcohol (LIQUIFILM TEARS) 1.4 % ophthalmic solution Place 1 drop into both eyes 4 (four) times daily.   Yes [provider]  primidone (MYSOLINE) 250 MG tablet Take 125-250 mg by mouth See admin instructions. 125mg  in the morning 250mg  at bedtime 04/08/20  Yes [provider]  PSYLLIUM FIBER PO Take 0.4 g by mouth 2 (two) times daily.   Yes [provider]  topiramate (TOPAMAX) 50 MG tablet Take 50 mg by mouth daily. 04/02/20  Yes [provider]    Allergies    Codeine  Review of Systems   Review of Systems  Constitutional: Negative for appetite change, chills and fever.  HENT: Negative for ear pain, rhinorrhea, sneezing and sore throat.   Eyes: Negative for photophobia and visual disturbance.  Respiratory: Positive for choking. Negative for cough, chest tightness, shortness of breath and wheezing.   Cardiovascular: Negative for chest pain and palpitations.  Gastrointestinal: Negative for abdominal pain, blood in stool, constipation, diarrhea, nausea and vomiting.  Genitourinary: Negative for dysuria, hematuria and urgency.  Musculoskeletal: Negative for myalgias.  Skin: Negative for rash.  Neurological: Negative for dizziness, weakness and light-headedness.    Physical  Exam Updated Vital Signs BP 125/72 (BP Location: Left Arm)   Pulse 67   Temp 98.9 F (37.2 C) (Oral)   Resp (!) 22   SpO2 96%   Physical Exam Vitals and nursing note reviewed.  Constitutional:      General: She is not in acute distress.    Appearance: She is well-developed. She is obese.  HENT:     Head: Normocephalic and atraumatic.     Nose: Nose normal.  Eyes:     General: No scleral icterus.       Right eye: No discharge.        Left eye: No discharge.     Conjunctiva/sclera: Conjunctivae normal.  Cardiovascular:     Rate and Rhythm: Normal rate and regular rhythm.     Heart sounds: Normal heart sounds. No murmur heard. No friction rub. No gallop.   Pulmonary:     Effort: Pulmonary effort is normal. No respiratory distress.     Breath sounds: Normal breath sounds.  Abdominal:     General: Bowel  sounds are normal. There is no distension.     Palpations: Abdomen is soft.     Tenderness: There is no abdominal tenderness. There is no guarding.  Musculoskeletal:        General: Normal range of motion.     Cervical back: Normal range of motion and neck supple.  Skin:    General: Skin is warm and dry.     Findings: No rash.  Neurological:     Mental Status: She is alert.     Motor: No abnormal muscle tone.     Coordination: Coordination normal.     ED Results / Procedures / Treatments   Labs (all labs ordered are listed, but only abnormal results are displayed) Labs Reviewed - No data to display  EKG None  Radiology DG Chest 2 View  Result Date: 06/20/2020 CLINICAL DATA:  Choking at lunch today, vomited, history of choking with EGD scheduled for 07/09/2020 EXAM: CHEST - 2 VIEW COMPARISON:  05/10/2020 FINDINGS: Enlargement of cardiac silhouette with pulmonary vascular congestion. Mediastinal contours normal. Decreased lung volumes with minimal bibasilar atelectasis. Remaining lungs clear. No pleural effusion or pneumothorax. IMPRESSION: Minimal bibasilar  atelectasis. Electronically Signed   By: Ulyses Southward M.D.   On: 06/20/2020 14:31    Procedures Procedures   Medications Ordered in ED Medications - No data to display  ED Course  I have reviewed the triage vital signs and the nursing notes.  Pertinent labs & imaging results that were available during my care of the patient were reviewed by me and considered in my medical decision making (see chart for details).  Clinical Course as of 06/20/20 1609  Thu Jun 20, 2020  1435 DG Chest 2 View No acute findings. [HK]  1439 On recheck, patient still able to tolerate water.  We will continue to observe. [HK]    Clinical Course User Index [HK] Dietrich Pates, PA-C   MDM Rules/Calculators/A&P                          70 year old female with past medical history of cognitive communication deficit, GERD without esophagitis, dysphagia with prior food impactions presenting to the ED for possible choking episode.  States that she was eating a black-eyed pea when she felt like she was choking on it.  She tried to drink water but was unable to keep it down.  Similar episode happened in EMS truck.  She was coughing during my initial evaluation.  She denies any other complaints.  I did give her some water to sip on at the bedside has been able to keep it down so far.  We will continue to monitor.  Patient observed here for 3 hours and tolerating secretions, able to keep down water.  I suspect that she self resolved this possible impaction.  She is comfortable with discharge home with continued home medications and following up with GI.  She finds hemodynamically stable.  Return precautions given.   Patient is hemodynamically stable, in NAD, and able to ambulate in the ED. Evaluation does not show pathology that would require ongoing emergent intervention or inpatient treatment. I explained the diagnosis to the patient. Pain has been managed and has no complaints prior to discharge. Patient is comfortable  with above plan and is stable for discharge at this time. All questions were answered prior to disposition. Strict return precautions for returning to the ED were discussed. Encouraged follow up with PCP.   An After  Visit Summary was printed and given to the patient.   Portions of this note were generated with Scientist, clinical (histocompatibility and immunogenetics). Dictation errors may occur despite best attempts at proofreading.  Final Clinical Impression(s) / ED Diagnoses Final diagnoses:  Choking due to food in larynx, initial encounter    Rx / DC Orders ED Discharge Orders    None       Dietrich Pates, PA-C 06/20/20 1609    Cathren Laine, MD 06/22/20 2226

## 2020-06-20 NOTE — Discharge Instructions (Addendum)
Continue your home medications. Follow-up with your primary care provider and GI doctor. Make sure you are chewing your food slowly and eating small bites at a time to prevent this from happening again. Return to the ER if you start to experience worsening choking sensation, chest pain, abdominal pain or shortness of breath.

## 2020-06-20 NOTE — ED Triage Notes (Signed)
Per EMS pt form [facility] for choking on lunch. Has history of choking and has EGD scheduled 4/26. Vomited at facility.  Pt at baseline.

## 2020-07-05 ENCOUNTER — Other Ambulatory Visit (HOSPITAL_COMMUNITY)
Admission: RE | Admit: 2020-07-05 | Discharge: 2020-07-05 | Disposition: A | Discharge status: Home / Self Care | Payer: Medicare Other | Source: Ambulatory Visit | Attending: Gastroenterology | Admitting: Gastroenterology

## 2020-07-05 DIAGNOSIS — Z01812 Encounter for preprocedural laboratory examination: Secondary | ICD-10-CM | POA: Insufficient documentation

## 2020-07-05 DIAGNOSIS — Z20822 Contact with and (suspected) exposure to covid-19: Secondary | ICD-10-CM | POA: Insufficient documentation

## 2020-07-05 NOTE — Progress Notes (Addendum)
Preop instructions for:     Monifah Freehling Date of Birth:    1950-12-24                   Date of Procedure:  Tuesday,  07-09-2020 Procedure:     Endoscopy with Dilation Surgeon: Kathi Der, MD  Facility contact:     Phone:  [phone]               Health Care POA: RN contact name/phone#:   and  Fax #: [phone]   Transportation contact phone#:     Time to arrive at [facility]: 6:30 AM   Report to: Admitting (On your left hand side)    Do not eat or drink past midnight the night before your procedure.(To include any tube feedings-must be discontinued)   Take these morning medications only with sips of water.(or give through gastrostomy or feeding tube). Buspar, Famotidine, Allegra, Hydroxyzine, Lamictal, Singulair, Topiramate, Primidone   The Day Before Surgery:    Note: No Insulin or Diabetic meds should be given or taken the morning of the procedure!     Please send day of procedure:current med list and meds last taken that day, confirm nothing by mouth status from what time, Patient Demographic info( to include DNR status, problem list, allergies)  [facility] has requested that someone stay at hospital while patient is here.   Bring Insurance card and picture ID Leave all jewelry and other valuables at place where living( no metal or rings to be worn) No contact lens Women-no make-up, no lotions,perfumes,powders   Any questions day of procedure,call  [facility] [phone]    Sent from :[facility]                   Phone:[phone]                   Fax:[phone]   Sent by :   Derek Mound, RN

## 2020-07-06 LAB — SARS CORONAVIRUS 2 (TAT 6-24 HRS): SARS Coronavirus 2: NEGATIVE

## 2020-07-08 ENCOUNTER — Encounter (HOSPITAL_COMMUNITY): Payer: Self-pay | Admitting: Gastroenterology

## 2020-07-08 NOTE — Anesthesia Preprocedure Evaluation (Addendum)
Anesthesia Evaluation  Patient identified by MRN, date of birth, ID band Patient awake    Reviewed: Allergy & Precautions, NPO status , Patient's Chart, lab work & pertinent test results, reviewed documented beta blocker date and time   Airway Mallampati: II  TM Distance: >3 FB Neck ROM: Full    Dental  (+) Missing, Edentulous Upper   Pulmonary neg pulmonary ROS,    Pulmonary exam normal breath sounds clear to auscultation       Cardiovascular hypertension, Pt. on medications and Pt. on home beta blockers Normal cardiovascular exam Rhythm:Regular Rate:Normal     Neuro/Psych Cognitive disability- lives in a group home negative neurological ROS  negative psych ROS   GI/Hepatic Neg liver ROS, GERD  Medicated and Controlled,Esophageal stricture    Endo/Other  Obesity  Renal/GU negative Renal ROS Bladder dysfunction      Musculoskeletal negative musculoskeletal ROS (+)   Abdominal (+) + obese,   Peds  Hematology negative hematology ROS (+)   Anesthesia Other Findings   Reproductive/Obstetrics                                                            Anesthesia Evaluation  Patient identified by MRN, date of birth, ID band Patient awake    Reviewed: Allergy & Precautions, NPO status , Patient's Chart, lab work & pertinent test results  History of Anesthesia Complications Negative for: history of anesthetic complications  Airway Mallampati: II  TM Distance: >3 FB Neck ROM: Full    Dental  (+) Edentulous Upper, Missing   Pulmonary neg pulmonary ROS,    Pulmonary exam normal        Cardiovascular negative cardio ROS Normal cardiovascular exam     Neuro/Psych Seizures -,  Cognitive disability- lives in group home negative psych ROS   GI/Hepatic negative GI ROS, Neg liver ROS,   Endo/Other  Morbid obesity  Renal/GU negative Renal ROS  negative genitourinary    Musculoskeletal negative musculoskeletal ROS (+)   Abdominal   Peds  Hematology negative hematology ROS (+)   Anesthesia Other Findings   Reproductive/Obstetrics                             Anesthesia Physical Anesthesia Plan  ASA: III and emergent  Anesthesia Plan: General   Post-op Pain Management:    Induction: Intravenous, Rapid sequence and Cricoid pressure planned  PONV Risk Score and Plan: 3 and Ondansetron, Dexamethasone, Treatment may vary due to age or medical condition and Midazolam  Airway Management Planned: Oral ETT  Additional Equipment: None  Intra-op Plan:   Post-operative Plan: Extubation in OR  Informed Consent: I have reviewed the patients History and Physical, chart, labs and discussed the procedure including the risks, benefits and alternatives for the proposed anesthesia with the patient or authorized representative who has indicated his/her understanding and acceptance.     Dental advisory given  Plan Discussed with:   Anesthesia Plan Comments:         Anesthesia Quick Evaluation  Anesthesia Physical Anesthesia Plan  ASA: III  Anesthesia Plan: MAC   Post-op Pain Management:    Induction:   PONV Risk Score and Plan: 3 and Treatment may vary due to age or medical condition and Propofol  infusion  Airway Management Planned: Natural Airway and Nasal Cannula  Additional Equipment:   Intra-op Plan:   Post-operative Plan: Extubation in OR  Informed Consent: I have reviewed the patients History and Physical, chart, labs and discussed the procedure including the risks, benefits and alternatives for the proposed anesthesia with the patient or authorized representative who has indicated his/her understanding and acceptance.     Dental advisory given  Plan Discussed with: CRNA and Anesthesiologist  Anesthesia Plan Comments:        Anesthesia Quick Evaluation                                   Anesthesia Evaluation  Patient identified by MRN, date of birth, ID band Patient awake    Reviewed: Allergy & Precautions, NPO status , Patient's Chart, lab work & pertinent test results  History of Anesthesia Complications Negative for: history of anesthetic complications  Airway Mallampati: II  TM Distance: >3 FB Neck ROM: Full    Dental  (+) Edentulous Upper, Missing   Pulmonary neg pulmonary ROS,    Pulmonary exam normal        Cardiovascular negative cardio ROS Normal cardiovascular exam     Neuro/Psych Seizures -,  Cognitive disability- lives in group home negative psych ROS   GI/Hepatic negative GI ROS, Neg liver ROS,   Endo/Other  Morbid obesity  Renal/GU negative Renal ROS  negative genitourinary   Musculoskeletal negative musculoskeletal ROS (+)   Abdominal   Peds  Hematology negative hematology ROS (+)   Anesthesia Other Findings   Reproductive/Obstetrics                             Anesthesia Physical Anesthesia Plan  ASA: III and emergent  Anesthesia Plan: General   Post-op Pain Management:    Induction: Intravenous, Rapid sequence and Cricoid pressure planned  PONV Risk Score and Plan: 3 and Ondansetron, Dexamethasone, Treatment may vary due to age or medical condition and Midazolam  Airway Management Planned: Oral ETT  Additional Equipment: None  Intra-op Plan:   Post-operative Plan: Extubation in OR  Informed Consent: I have reviewed the patients History and Physical, chart, labs and discussed the procedure including the risks, benefits and alternatives for the proposed anesthesia with the patient or authorized representative who has indicated his/her understanding and acceptance.     Dental advisory given  Plan Discussed with:   Anesthesia Plan Comments:         Anesthesia Quick Evaluation

## 2020-07-09 ENCOUNTER — Ambulatory Visit (HOSPITAL_COMMUNITY): Payer: Medicare Other | Admitting: Anesthesiology

## 2020-07-09 ENCOUNTER — Ambulatory Visit (HOSPITAL_COMMUNITY)
Admission: RE | Admit: 2020-07-09 | Discharge: 2020-07-09 | Disposition: A | Discharge status: Skilled Nursing Facility | Payer: Medicare Other | Attending: Gastroenterology | Admitting: Gastroenterology

## 2020-07-09 ENCOUNTER — Other Ambulatory Visit: Payer: Self-pay

## 2020-07-09 ENCOUNTER — Encounter (HOSPITAL_COMMUNITY): Payer: Self-pay | Admitting: Gastroenterology

## 2020-07-09 ENCOUNTER — Encounter (HOSPITAL_COMMUNITY)
Admission: RE | Disposition: A | Discharge status: Skilled Nursing Facility | Payer: Self-pay | Source: Home / Self Care | Attending: Gastroenterology

## 2020-07-09 DIAGNOSIS — K2289 Other specified disease of esophagus: Secondary | ICD-10-CM | POA: Diagnosis not present

## 2020-07-09 DIAGNOSIS — Z20822 Contact with and (suspected) exposure to covid-19: Secondary | ICD-10-CM | POA: Insufficient documentation

## 2020-07-09 DIAGNOSIS — K2970 Gastritis, unspecified, without bleeding: Secondary | ICD-10-CM | POA: Diagnosis not present

## 2020-07-09 DIAGNOSIS — K3189 Other diseases of stomach and duodenum: Secondary | ICD-10-CM | POA: Diagnosis not present

## 2020-07-09 DIAGNOSIS — K449 Diaphragmatic hernia without obstruction or gangrene: Secondary | ICD-10-CM | POA: Diagnosis not present

## 2020-07-09 DIAGNOSIS — K222 Esophageal obstruction: Secondary | ICD-10-CM | POA: Insufficient documentation

## 2020-07-09 DIAGNOSIS — R1310 Dysphagia, unspecified: Secondary | ICD-10-CM | POA: Diagnosis present

## 2020-07-09 HISTORY — PX: ESOPHAGEAL DILATION: SHX303

## 2020-07-09 HISTORY — DX: Cognitive communication deficit: R41.841

## 2020-07-09 HISTORY — DX: Dysphagia, unspecified: R13.10

## 2020-07-09 HISTORY — DX: Esophageal obstruction: K22.2

## 2020-07-09 HISTORY — PX: ESOPHAGOGASTRODUODENOSCOPY (EGD) WITH PROPOFOL: SHX5813

## 2020-07-09 HISTORY — PX: BIOPSY: SHX5522

## 2020-07-09 LAB — RESP PANEL BY RT-PCR (FLU A&B, COVID) ARPGX2
Influenza A by PCR: NEGATIVE
Influenza B by PCR: NEGATIVE
SARS Coronavirus 2 by RT PCR: NEGATIVE

## 2020-07-09 SURGERY — ESOPHAGOGASTRODUODENOSCOPY (EGD) WITH PROPOFOL
Anesthesia: Monitor Anesthesia Care

## 2020-07-09 MED ORDER — LIDOCAINE 2% (20 MG/ML) 5 ML SYRINGE
INTRAMUSCULAR | Status: DC | PRN
  Administered 2020-07-09: 40 mg via INTRAVENOUS

## 2020-07-09 MED ORDER — PROPOFOL 500 MG/50ML IV EMUL
INTRAVENOUS | Status: DC | PRN
  Administered 2020-07-09: 125 ug/kg/min via INTRAVENOUS

## 2020-07-09 MED ORDER — PROPOFOL 1000 MG/100ML IV EMUL
INTRAVENOUS | Status: AC
  Filled 2020-07-09: qty 100

## 2020-07-09 MED ORDER — LACTATED RINGERS IV SOLN: INTRAVENOUS | Status: DC

## 2020-07-09 MED ORDER — PROPOFOL 10 MG/ML IV BOLUS
INTRAVENOUS | Status: AC
  Filled 2020-07-09: qty 20

## 2020-07-09 MED ORDER — PANTOPRAZOLE SODIUM 40 MG PO TBEC: 40.0000 mg | DELAYED_RELEASE_TABLET | Freq: Every day | ORAL | 2 refills | Status: DC

## 2020-07-09 MED ORDER — PROPOFOL 10 MG/ML IV BOLUS
INTRAVENOUS | Status: DC | PRN
  Administered 2020-07-09: 20 mg via INTRAVENOUS
  Administered 2020-07-09: 10 mg via INTRAVENOUS

## 2020-07-09 SURGICAL SUPPLY — 15 items

## 2020-07-09 NOTE — Anesthesia Postprocedure Evaluation (Signed)
Anesthesia Post Note  Patient: Erica Griffin  Procedure(s) Performed: ESOPHAGOGASTRODUODENOSCOPY (EGD) WITH PROPOFOL (N/A ) SAVORY DILATION (N/A ) BIOPSY     Patient location during evaluation: PACU Anesthesia Type: MAC Level of consciousness: awake and alert and oriented Pain management: pain level controlled Vital Signs Assessment: post-procedure vital signs reviewed and stable Respiratory status: spontaneous breathing, nonlabored ventilation and respiratory function stable Cardiovascular status: stable and blood pressure returned to baseline Postop Assessment: no apparent nausea or vomiting Anesthetic complications: no   No complications documented.  Last Vitals:  Vitals:   07/09/20 0828 07/09/20 0917  BP: 140/82   Pulse:  71  Resp: (!) 24 16  Temp: 37 C   SpO2: 97% 98%    Last Pain:  Vitals:   07/09/20 0828  TempSrc: Oral  PainSc: 0-No pain                 Cyana Shook A.

## 2020-07-09 NOTE — H&P (Signed)
Erica Griffin is a 70 y.o. female has presented with dysphagia.  Previous EGD showed distal esophageal ulcer probably from pill induced esophagitis and esophageal stricture the various methods of treatment have been discussed with the patient and family. After consideration of risks, benefits and other options for treatment, the patient has consented to  Procedure(s): EGD with possible dilation  as a surgical intervention .  The patient's history has been reviewed, patient examined, no change in status, stable for surgery.  I have reviewed the patient's chart and labs.  Questions were answered to the patient's satisfaction.   Risks (bleeding, infection, bowel perforation that could require surgery, sedation-related changes in cardiopulmonary systems), benefits (identification and possible treatment of source of symptoms, exclusion of certain causes of symptoms), and alternatives (watchful waiting, radiographic imaging studies, empiric medical treatment)  were explained to patient/family in detail and patient wishes to proceed.  Kathi Der MD, FACP 07/09/2020, 8:18 AM  Contact #  [phone]

## 2020-07-09 NOTE — Op Note (Signed)
[facility] Patient Name: Erica MoritaDebra Hincapie Procedure Date: 07/09/2020 MRN: [id number] Attending MD: Kathi DerParag Linde Wilensky , MD Date of Birth: 11/05/1950 CSN: [id number] Age: [age] Admit Type: Outpatient Procedure:                Upper GI endoscopy Indications:              Dysphagia Providers:                Kathi DerParag Vanesha Athens, MD, Estella HuskJarmila Fucs RN, RN, Fransisca ConnorsMichael                            Williams, Rosilyn MingsNichole Montalvo, Technician Referring MD:              Medicines:                Sedation Administered by an Anesthesia Professional Complications:            No immediate complications. Estimated Blood Loss:     Estimated blood loss was minimal. Procedure:                Pre-Anesthesia Assessment:                           - Prior to the procedure, a History and Physical                            was performed, and patient medications and                            allergies were reviewed. The patient's tolerance of                            previous anesthesia was also reviewed. The risks                            and benefits of the procedure and the sedation                            options and risks were discussed with the patient.                            All questions were answered, and informed consent                            was obtained. Prior Anticoagulants: The patient has                            taken no previous anticoagulant or antiplatelet                            agents. ASA Grade Assessment: III - A patient with                            severe systemic disease. After reviewing the risks  and benefits, the patient was deemed in                            satisfactory condition to undergo the procedure.                           After obtaining informed consent, the endoscope was                            passed under direct vision. Throughout the                            procedure, the patient's blood pressure, pulse, and                             oxygen saturations were monitored continuously. The                            GIF-H190 ([id number]) Olympus gastroscope was                            introduced through the mouth, and advanced to the                            second part of duodenum. The upper GI endoscopy was                            accomplished without difficulty. The patient                            tolerated the procedure well. Scope In: Scope Out: Findings:      A non-obstructing Schatzki ring was found at the gastroesophageal       junction. A TTS dilator was passed through the scope. Dilation with a       15-16.5-18 mm balloon dilator was performed to 15 mm. The dilation site       was examined following endoscope reinsertion and showed moderate mucosal       disruption, moderate improvement in luminal narrowing and no perforation.      Mucosal changes including congestion (edema) were found in the mid       esophagus and in the distal esophagus. Biopsies were obtained from the       proximal and distal esophagus with cold forceps for histology of       suspected eosinophilic esophagitis.      A medium-sized hiatal hernia was present.      Scattered minimal inflammation characterized by erythema was found in       the entire examined stomach. Biopsies were taken with a cold forceps for       histology.      The cardia and gastric fundus were normal on retroflexion.      The duodenal bulb, first portion of the duodenum and second portion of       the duodenum were normal. Impression:               - Non-obstructing Schatzki ring. Dilated.                           -  Esophageal mucosal changes suspicious for                            eosinophilic esophagitis. Biopsied.                           - Medium-sized hiatal hernia.                           - Gastritis. Biopsied.                           - Normal duodenal bulb, first portion of the                            duodenum and second  portion of the duodenum. Moderate Sedation:      Moderate (conscious) sedation was personally administered by an       anesthesia professional. The following parameters were monitored: oxygen       saturation, heart rate, blood pressure, and response to care. Recommendation:           - Patient has a contact number available for                            emergencies. The signs and symptoms of potential                            delayed complications were discussed with the                            patient. Return to normal activities tomorrow.                            Written discharge instructions were provided to the                            patient.                           - Resume previous diet.                           - Continue present medications.                           - Await pathology results.                           - Return to my office as previously scheduled. Procedure Code(s):        --- Professional ---                           [phone], Esophagogastroduodenoscopy, flexible,                            transoral; with transendoscopic balloon dilation of  esophagus (less than 30 mm diameter)                           43239, 59, Esophagogastroduodenoscopy, flexible,                            transoral; with biopsy, single or multiple Diagnosis Code(s):        --- Professional ---                           K22.2, Esophageal obstruction                           K22.8, Other specified diseases of esophagus                           K44.9, Diaphragmatic hernia without obstruction or                            gangrene                           K29.70, Gastritis, unspecified, without bleeding                           R13.10, Dysphagia, unspecified CPT copyright 2019 American Medical Association. All rights reserved. The codes documented in this report are preliminary and upon coder review may  be revised to meet current compliance  requirements. Kathi Der, MD Kathi Der, MD 07/09/2020 9:14:54 AM Number of Addenda: 0

## 2020-07-09 NOTE — Transfer of Care (Signed)
Immediate Anesthesia Transfer of Care Note  Patient: Erica Griffin  Procedure(s) Performed: ESOPHAGOGASTRODUODENOSCOPY (EGD) WITH PROPOFOL (N/A ) SAVORY DILATION (N/A ) BIOPSY  Patient Location: PACU  Anesthesia Type:MAC  Level of Consciousness: awake, alert  and oriented  Airway & Oxygen Therapy: Patient Spontanous Breathing and Patient connected to face mask oxygen  Post-op Assessment: Report given to RN, Post -op Vital signs reviewed and stable and Patient moving all extremities X 4  Post vital signs: Reviewed and stable  Last Vitals:  Vitals Value Taken Time  BP    Temp    Pulse    Resp    SpO2      Last Pain:  Vitals:   07/09/20 0828  TempSrc: Oral  PainSc: 0-No pain         Complications: No complications documented.

## 2020-07-09 NOTE — Discharge Instructions (Signed)
YOU HAD AN ENDOSCOPIC PROCEDURE TODAY: Refer to the procedure report and other information in the discharge instructions given to you for any specific questions about what was found during the examination. If this information does not answer your questions, please call [place] office at [phone] to clarify.   YOU SHOULD EXPECT: Some feelings of bloating in the abdomen. Passage of more gas than usual. Walking can help get rid of the air that was put into your GI tract during the procedure and reduce the bloating.  DIET: Your first meal following the procedure should be a light meal and then it is ok to progress to your normal diet. A half-sandwich or bowl of soup is an example of a good first meal. Heavy or fried foods are harder to digest and may make you feel nauseous or bloated. Drink plenty of fluids but you should avoid alcoholic beverages for 24 hours. If you had a esophageal dilation, soft foods today and advance to regular diet tomorrow.  ACTIVITY: Your care partner should take you home directly after the procedure. You should plan to take it easy, moving slowly for the rest of the day. You can resume normal activity the day after the procedure however YOU SHOULD NOT DRIVE, use power tools, machinery or perform tasks that involve climbing or major physical exertion for 24 hours (because of the sedation medicines used during the test).   SYMPTOMS TO REPORT IMMEDIATELY: A gastroenterologist can be reached at any hour. Please call [phone]  for any of the following symptoms:   . Following upper endoscopy (EGD, EUS, ERCP, esophageal dilation) Vomiting of blood or coffee ground material  New, significant abdominal pain  New, significant chest pain or pain under the shoulder blades  Painful or persistently difficult swallowing  New shortness of breath  Black, tarry-looking or red, bloody stools  FOLLOW UP:  If any biopsies were taken you will be contacted by phone or by letter within the  next 1-3 weeks. Call [phone]  if you have not heard about the biopsies in 3 weeks.  Please also call with any specific questions about appointments or follow up tests.

## 2020-07-09 NOTE — Anesthesia Procedure Notes (Signed)
Procedure Name: MAC Date/Time: 07/09/2020 8:49 AM Performed by: Niel Hummer, CRNA Pre-anesthesia Checklist: Patient identified, Emergency Drugs available, Suction available and Patient being monitored Oxygen Delivery Method: Simple face mask

## 2020-07-10 ENCOUNTER — Encounter (HOSPITAL_COMMUNITY): Payer: Self-pay | Admitting: Gastroenterology

## 2020-07-11 ENCOUNTER — Encounter: Payer: Self-pay | Admitting: Pulmonary Disease

## 2020-07-11 ENCOUNTER — Other Ambulatory Visit: Payer: Self-pay

## 2020-07-11 ENCOUNTER — Ambulatory Visit (INDEPENDENT_AMBULATORY_CARE_PROVIDER_SITE_OTHER): Payer: Medicare Other | Admitting: Pulmonary Disease

## 2020-07-11 VITALS — BP 130/86 | HR 64 | Temp 98.0°F | Ht 64.0 in

## 2020-07-11 DIAGNOSIS — R053 Chronic cough: Secondary | ICD-10-CM | POA: Diagnosis not present

## 2020-07-11 LAB — SURGICAL PATHOLOGY

## 2020-07-11 NOTE — Patient Instructions (Addendum)
Chronic cough, suspect secondary to reflux +/- aspiration Discussed possible etiologies of cough including undiagnosed asthma, upper airway cough syndrome, and reflux. --Will arrange pulmonary function to rule out asthma. If positive, will start LABA/ICS --INCREASE protonix 40 mg to twice a day --Refer to Speech for dysphagia and suspected aspiration --Start aspiration precautions if not already on  Follow-up with NP or me after PFTs

## 2020-07-11 NOTE — Progress Notes (Signed)
Subjective:   PATIENT ID: Rod Can GENDER: female DOB: 1950-04-02, MRN: [id number]   HPI  Chief Complaint  Patient presents with  . Consult    Reports cough since past February, mainly occurs with eating.    Reason for Visit: New consult for chronic cough  Ms. Keera Altidor is a 70 year old female never smoker with GERD, hx of esophageal ulcer and Schatzki ring s/p dilation, hx COVID-19 2020 who presents as new a patient for chronic cough.  She reports chronic cough usually during eating. Since February she has had three episodes of ED visit involving choking. Every time she eats she coughs. Even when not eating she will have coughing spells. Not associated with swallowing saliva. Cough is non productive. She recently underwent an upper EGD and found with non-obstructing Schatzki ring was found at the gastroesophageal junction that was dilated on 07/09/20. Her caregiver reports that it seems worse than her procedure two days ago. Sometimes wheezing. Denies allergies. No nasal congestion, sinus issue. She does report a history of asthma that she uses a rescue inhaler twice a day. Denies fevers, chills.  Social History: Never smoker  I have personally reviewed patient's past medical/family/social history, allergies, current medications.  Past Medical History:  Diagnosis Date  . Cognitive communication deficit   . Dysphagia   . Esophageal stricture      Family History  Problem Relation Age of Onset  . Asthma Maternal Aunt      Social History   Occupational History  . Not on file  Tobacco Use  . Smoking status: Never Smoker  . Smokeless tobacco: Never Used  Vaping Use  . Vaping Use: Never used  Substance and Sexual Activity  . Alcohol use: Never  . Drug use: Never  . Sexual activity: Not on file    Allergies  Allergen Reactions  . Codeine Nausea And Vomiting     Outpatient Medications Prior to Visit  Medication Sig Dispense Refill  . acetaminophen (TYLENOL)  325 MG tablet Take 650 mg by mouth 2 (two) times daily.    Marland Kitchen acetaminophen (TYLENOL) 500 MG tablet Take 1,000 mg by mouth every 8 (eight) hours as needed for mild pain.    Marland Kitchen albuterol (VENTOLIN HFA) 108 (90 Base) MCG/ACT inhaler Inhale 2 puffs into the lungs 2 (two) times daily.    Marland Kitchen atenolol-chlorthalidone (TENORETIC) 100-25 MG tablet Take 0.5 tablets by mouth daily.    Marland Kitchen azelastine (ASTELIN) 0.1 % nasal spray Place 1 spray into both nostrils 2 (two) times daily.    . busPIRone (BUSPAR) 10 MG tablet Take 10 mg by mouth 2 (two) times daily.    . calcium carbonate (TUMS - DOSED IN MG ELEMENTAL CALCIUM) 500 MG chewable tablet Chew 1,000 mg by mouth 2 (two) times daily.    . Cholecalciferol (VITAMIN D-3) 125 MCG (5000 UT) TABS Take 5,000 Units by mouth daily.    Marland Kitchen dextromethorphan (DELSYM) 30 MG/5ML liquid Take 60 mg by mouth every 12 (twelve) hours as needed for cough.    . famotidine (PEPCID) 20 MG tablet Take 20 mg by mouth daily.    . fexofenadine (ALLEGRA) 180 MG tablet Take 180 mg by mouth daily.    . fluticasone (FLONASE) 50 MCG/ACT nasal spray Place 1 spray into both nostrils daily.    Marland Kitchen guaiFENesin (MUCINEX) 600 MG 12 hr tablet Take 600 mg by mouth 2 (two) times daily.    . hydrOXYzine (ATARAX/VISTARIL) 10 MG tablet Take 10 mg  by mouth 2 (two) times daily.    Marland Kitchen lamoTRIgine (LAMICTAL) 150 MG tablet Take 150 mg by mouth daily.    Marland Kitchen lidocaine (LIDODERM) 5 % Place 1 patch onto the skin daily. Remove & Discard patch within 12 hours or as directed by MD  TO BILATERAL THIGH    . LUMIGAN 0.01 % SOLN Place 1 drop into both eyes daily.    . montelukast (SINGULAIR) 10 MG tablet Take 10 mg by mouth daily.    . Multiple Vitamin (MULTIVITAMIN WITH MINERALS) TABS tablet Take 1 tablet by mouth daily.    Marland Kitchen oxybutynin (DITROPAN-XL) 5 MG 24 hr tablet Take 5 mg by mouth daily.    . pantoprazole (PROTONIX) 40 MG tablet Take 1 tablet (40 mg total) by mouth daily. 60 tablet 2  . polyvinyl alcohol (LIQUIFILM  TEARS) 1.4 % ophthalmic solution Place 1 drop into both eyes 4 (four) times daily.    . primidone (MYSOLINE) 250 MG tablet Take 125-250 mg by mouth See admin instructions. 125mg  in the morning 250mg  at bedtime    . PSYLLIUM FIBER PO Take 0.4 g by mouth 2 (two) times daily.    topiramate (TOPAMAX) 50 MG tablet Take 50 mg by mouth daily.     No facility-administered medications prior to visit.    Review of Systems  Constitutional: Negative for chills, diaphoresis, fever, malaise/fatigue and weight loss.  HENT: Negative for congestion, ear pain and sore throat.   Respiratory: Positive for cough. Negative for hemoptysis, sputum production, shortness of breath and wheezing.   Cardiovascular: Negative for chest pain, palpitations and leg swelling.  Gastrointestinal: Negative for abdominal pain, heartburn and nausea.       Choking   Genitourinary: Negative for frequency.  Musculoskeletal: Negative for joint pain and myalgias.  Skin: Negative for itching and rash.  Neurological: Negative for dizziness, weakness and headaches.  Endo/Heme/Allergies: Does not bruise/bleed easily.  Psychiatric/Behavioral: Negative for depression. The patient is not nervous/anxious.      Objective:   Vitals:   07/11/20 1203  BP: 130/86  Pulse: 64  Temp: 98 F (36.7 C)  TempSrc: Temporal  SpO2: 98%  Height: 5\' 4"  (1.626 m)      Physical Exam: General: Well-appearing, no acute distress HENT: [place], AT, OP clear, MMM Eyes: EOMI, no scleral icterus Respiratory: Clear to auscultation bilaterally.  No crackles, wheezing or rales Cardiovascular: RRR, -M/R/G, no JVD GI: BS+, soft, nontender Extremities:-Edema,-tenderness Neuro: AAO x4, CNII-XII grossly intact Skin: Intact, no rashes or bruising Psych: Normal mood, normal affect  Data Reviewed:  Imaging: CXR 06/20/20 - Small lung volumes with bibasilar atelectasis. 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 Caretaker present.  Chronic cough, suspect secondary to reflux +/- aspiration Discussed possible etiologies of cough including undiagnosed asthma, upper airway  cough syndrome, and reflux. --Will arrange pulmonary function to rule out asthma. If positive, will start LABA/ICS --INCREASE protonix 40 mg to twice a day --Refer to Speech for dysphagia and suspected aspiration --Start aspiration precautions if not already on  Health Maintenance  There is no immunization history on file for this patient.   Orders Placed This Encounter  Procedures  . Ambulatory referral to Speech Therapy    Referral Priority:   Routine    Referral Type:   Speech Therapy    Referral Reason:   Specialty Services Required    Requested Specialty:   Speech Pathology    Number of Visits Requested:   1  . Pulmonary Function Test    Standing Status:   Future    Standing Expiration Date:   07/11/2021    Order Specific Question:   Where should this test be performed?    Answer:   [facility]    Order Specific Question:   Full PFT: includes the following: basic spirometry, spirometry pre & post bronchodilator, diffusion capacity (DLCO), lung volumes    Answer:   Full PFT  No orders of the defined types were placed in this encounter.   No follow-ups on file.  I have spent a total time of 45-minutes on the day of the appointment reviewing prior documentation,  coordinating care and discussing medical diagnosis and plan with the patient/family. Imaging, labs and tests included in this note have been reviewed and interpreted independently by me.  Antar Milks Mechele Collin, MD [facility] 07/11/2020 12:03 PM  Office Number [phone]

## 2020-07-16 ENCOUNTER — Other Ambulatory Visit (HOSPITAL_COMMUNITY)
Admission: RE | Admit: 2020-07-16 | Discharge: 2020-07-16 | Disposition: A | Discharge status: Home / Self Care | Payer: Medicare Other | Source: Ambulatory Visit | Attending: Pulmonary Disease | Admitting: Pulmonary Disease

## 2020-07-16 DIAGNOSIS — Z20822 Contact with and (suspected) exposure to covid-19: Secondary | ICD-10-CM | POA: Diagnosis not present

## 2020-07-16 DIAGNOSIS — Z01812 Encounter for preprocedural laboratory examination: Secondary | ICD-10-CM | POA: Diagnosis present

## 2020-07-17 LAB — SARS CORONAVIRUS 2 (TAT 6-24 HRS): SARS Coronavirus 2: NEGATIVE

## 2020-07-19 ENCOUNTER — Ambulatory Visit: Payer: Medicare Other | Admitting: Pulmonary Disease

## 2020-07-19 ENCOUNTER — Ambulatory Visit (INDEPENDENT_AMBULATORY_CARE_PROVIDER_SITE_OTHER): Payer: Medicare Other | Admitting: Adult Health

## 2020-07-19 ENCOUNTER — Encounter: Payer: Self-pay | Admitting: Adult Health

## 2020-07-19 ENCOUNTER — Other Ambulatory Visit: Payer: Self-pay

## 2020-07-19 DIAGNOSIS — R053 Chronic cough: Secondary | ICD-10-CM | POA: Diagnosis not present

## 2020-07-19 DIAGNOSIS — R1310 Dysphagia, unspecified: Secondary | ICD-10-CM | POA: Diagnosis not present

## 2020-07-19 NOTE — Patient Instructions (Addendum)
Ventolin inhaler 1-2 puffs every 6hr as needed.  Aspiration precautions  GERD diet  Activity as tolerated.  Continue on protonix Twice daily   Continue on Allegra and Singulair daily  Follow up with Dr. Everardo All in 3-4 months and As needed   Please contact office for sooner follow up if symptoms do not improve or worsen or seek emergency care

## 2020-07-19 NOTE — Progress Notes (Signed)
@Patient  ID: , female    DOB: 09/01/1950, 70 y.o.   MRN: [id number]  Chief Complaint  Patient presents with  . Follow-up    Referring provider: [id number], MD  HPI: 70 year old female never smoker seen for pulmonary consult July 11, 2020 for chronic cough. Patient is a skilled nursing resident ([facility]) -is wheelchair-bound with an intellectual disability  TEST/EVENTS :   07/19/2020 Follow up ; Cough  Patient returns for a 1 week follow-up.  Patient was seen last visit for a pulmonary consult.  She complained of a cough that is worsened with eating.  Patient has had 3 emergency room visits since February 2022 that involved choking.  She underwent an EGD and found a nonobstructing chest x-ray showed small volume Schatzki ring at the gastroesophageal junction.  This was dilated on April 26.  Despite this she did not have any improvement in her cough.  Chest x-ray on June 20, 2020 showed small lung volumes with bibasilar atelectasis.  Eosinophils were at 100.  Last visit patient  Was recommended on aspirations precautions.  Referred to speech for dysphagia and recommend increase Protonix up to twice a day.  She was set up for pulmonary function testing but unfortunately was unable to complete that today. She says Speech therapy has worked with her for the last week . Taught her to take her time with eating .  Drinks soda frequently , discussed avoiding. She said she is not giving up her sodas.   Allergies  Allergen Reactions  . Codeine Nausea And Vomiting    Immunization History  Administered Date(s) Administered  . Moderna SARS-COV2 Booster Vaccination 02/01/2020  . Moderna Sars-Covid-2 Vaccination 03/13/2019, 04/10/2019    Past Medical History:  Diagnosis Date  . Allergic rhinitis   . Asthma   . Cognitive communication deficit   . Dysphagia   . Esophageal stricture     Tobacco History: Social History   Tobacco Use  Smoking Status Never Smoker   Smokeless Tobacco Never Used   Counseling given: Not Answered   Outpatient Medications Prior to Visit  Medication Sig Dispense Refill  . acetaminophen (TYLENOL) 325 MG tablet Take 650 mg by mouth 2 (two) times daily.    04/12/2019 acetaminophen (TYLENOL) 500 MG tablet Take 1,000 mg by mouth every 8 (eight) hours as needed for mild pain.    Marland Kitchen albuterol (VENTOLIN HFA) 108 (90 Base) MCG/ACT inhaler Inhale 2 puffs into the lungs 2 (two) times daily.    Marland Kitchen atenolol-chlorthalidone (TENORETIC) 100-25 MG tablet Take 0.5 tablets by mouth daily.    Marland Kitchen azelastine (ASTELIN) 0.1 % nasal spray Place 1 spray into both nostrils 2 (two) times daily.    . busPIRone (BUSPAR) 10 MG tablet Take 10 mg by mouth 2 (two) times daily.    . calcium carbonate (TUMS - DOSED IN MG ELEMENTAL CALCIUM) 500 MG chewable tablet Chew 1,000 mg by mouth 2 (two) times daily.    . Cholecalciferol (VITAMIN D-3) 125 MCG (5000 UT) TABS Take 5,000 Units by mouth daily.    Marland Kitchen dextromethorphan (DELSYM) 30 MG/5ML liquid Take 60 mg by mouth every 12 (twelve) hours as needed for cough.    . famotidine (PEPCID) 20 MG tablet Take 20 mg by mouth daily.    . fexofenadine (ALLEGRA) 180 MG tablet Take 180 mg by mouth daily.    . fluticasone (FLONASE) 50 MCG/ACT nasal spray Place 1 spray into both nostrils daily.    Marland Kitchen guaiFENesin (MUCINEX) 600  MG 12 hr tablet Take 600 mg by mouth 2 (two) times daily.    . hydrOXYzine (ATARAX/VISTARIL) 10 MG tablet Take 10 mg by mouth 2 (two) times daily.    Marland Kitchen lamoTRIgine (LAMICTAL) 150 MG tablet Take 150 mg by mouth daily.    Marland Kitchen lidocaine (LIDODERM) 5 % Place 1 patch onto the skin daily. Remove & Discard patch within 12 hours or as directed by MD  TO BILATERAL THIGH    . LUMIGAN 0.01 % SOLN Place 1 drop into both eyes daily.    . montelukast (SINGULAIR) 10 MG tablet Take 10 mg by mouth daily.    . Multiple Vitamin (MULTIVITAMIN WITH MINERALS) TABS tablet Take 1 tablet by mouth daily.    Marland Kitchen oxybutynin (DITROPAN-XL) 5 MG 24  hr tablet Take 5 mg by mouth daily.    . pantoprazole (PROTONIX) 40 MG tablet Take 1 tablet (40 mg total) by mouth daily. 60 tablet 2  . polyvinyl alcohol (LIQUIFILM TEARS) 1.4 % ophthalmic solution Place 1 drop into both eyes 4 (four) times daily.    . primidone (MYSOLINE) 250 MG tablet Take 125-250 mg by mouth See admin instructions. 125mg  in the morning 250mg  at bedtime    . PSYLLIUM FIBER PO Take 0.4 g by mouth 2 (two) times daily.    topiramate (TOPAMAX) 50 MG tablet Take 50 mg by mouth daily.     No facility-administered medications prior to visit.     Review of Systems:   Constitutional:   No  weight loss, night sweats,  Fevers, chills,  +fatigue, or  lassitude.  HEENT:   No headaches,  Difficulty swallowing,  Tooth/dental problems, or  Sore throat,                No sneezing, itching, ear ache, nasal congestion, post nasal drip,   CV:  No chest pain,  Orthopnea, PND, swelling in lower extremities, anasarca, dizziness, palpitations, syncope.   GI  No heartburn, indigestion, abdominal pain, nausea, vomiting, diarrhea, change in bowel habits, loss of appetite, bloody stools.   Resp:    No chest wall deformity  Skin: no rash or lesions.  GU: no dysuria, change in color of urine, no urgency or frequency.  No flank pain, no hematuria   MS:  No joint pain or swelling.  No decreased range of motion.  No back pain.    Physical Exam  BP 120/64 (BP Location: Right Arm, Patient Position: Sitting, Cuff Size: Large)   Pulse 66   Temp (!) 97.5 F (36.4 C) (Temporal)   Ht 5\' 4"  (1.626 m)   Wt 264 lb (119.7 kg)   SpO2 95%   BMI 45.32 kg/m   GEN: A/Ox3; pleasant , NAD, well nourished    HEENT:  [place]/AT,    NOSE-clear, THROAT-clear, no lesions, no postnasal drip or exudate noted.   NECK:  Supple w/ fair ROM; no JVD; normal carotid impulses w/o bruits; no thyromegaly or nodules palpated; no lymphadenopathy.    RESP  Clear  P & A; w/o, wheezes/ rales/ or rhonchi. no accessory  muscle use, no dullness to percussion  CARD:  RRR, no m/r/g, tr  peripheral edema, pulses intact, no cyanosis or clubbing.  GI:   Soft & nt; nml bowel sounds; no organomegaly or masses detected.   Musco: Warm bil, no deformities or joint swelling noted.   Neuro: alert, no focal deficits noted.    Skin: Warm, no lesions or rashes    Lab Results:  CBC  No results found for: BNP  ProBNP No results found for: PROBNP  Imaging: DG Chest 2 View  Result Date: 06/20/2020 CLINICAL DATA:  Choking at lunch today, vomited, history of choking with EGD scheduled for 07/09/2020 EXAM: CHEST - 2 VIEW COMPARISON:  05/10/2020 FINDINGS: Enlargement of cardiac silhouette with pulmonary vascular congestion. Mediastinal contours normal. Decreased lung volumes with minimal bibasilar atelectasis. Remaining lungs clear. No pleural effusion or pneumothorax. IMPRESSION: Minimal bibasilar atelectasis. Electronically Signed   By: Ulyses Southward M.D.   On: 06/20/2020 14:31      No flowsheet data found.  No results found for: NITRICOXIDE      Assessment & Plan:   Dysphagia Dysphagia continue with swallow and aspiration precautions.  Continue with speech therapy recommendations  Plan  Patient Instructions  Ventolin inhaler 1-2 puffs every 6hr as needed.  Aspiration precautions  GERD diet  Activity as tolerated.  Continue on protonix Twice daily   Continue on Allegra and Singulair daily  Follow up with Dr. Everardo All in 3-4 months and As needed   Please contact office for sooner follow up if symptoms do not improve or worsen or seek emergency care        Chronic cough Chronic cough may be secondary to postnasal drainage, GERD, dysphagia May have component of RAD , continue on SABA   Plan  Patient Instructions  Ventolin inhaler 1-2 puffs every 6hr as needed.  Aspiration precautions  GERD diet  Activity as tolerated.  Continue on protonix Twice daily   Continue on Allegra and Singulair  daily  Follow up with Dr. Everardo All in 3-4 months and As needed   Please contact office for sooner follow up if symptoms do not improve or worsen or seek emergency care           Rubye Oaks, NP 07/19/2020

## 2020-07-19 NOTE — Patient Instructions (Signed)
Test not performed.

## 2020-07-19 NOTE — Assessment & Plan Note (Signed)
Chronic cough may be secondary to postnasal drainage, GERD, dysphagia May have component of RAD , continue on SABA   Plan  Patient Instructions  Ventolin inhaler 1-2 puffs every 6hr as needed.  Aspiration precautions  GERD diet  Activity as tolerated.  Continue on protonix Twice daily   Continue on Allegra and Singulair daily  Follow up with Dr. Everardo All in 3-4 months and As needed   Please contact office for sooner follow up if symptoms do not improve or worsen or seek emergency care

## 2020-07-19 NOTE — Progress Notes (Signed)
Patient not able to understand direction to complete PFT.

## 2020-07-19 NOTE — Assessment & Plan Note (Signed)
Dysphagia continue with swallow and aspiration precautions.  Continue with speech therapy recommendations  Plan  Patient Instructions  Ventolin inhaler 1-2 puffs every 6hr as needed.  Aspiration precautions  GERD diet  Activity as tolerated.  Continue on protonix Twice daily   Continue on Allegra and Singulair daily  Follow up with Dr. Everardo All in 3-4 months and As needed   Please contact office for sooner follow up if symptoms do not improve or worsen or seek emergency care

## 2020-07-26 NOTE — H&P (Signed)
Primary Care Physician:  Erica Dupes, MD Primary Gastroenterologist:  [name]  Reason for Visit : dysphagia, esophageal stricture  HPI: Erica Griffin is a 70 y.o. female with possible history of mild cognitive deficiency, muscle weakness currently lives at [facility] and rehab here for further evaluation of dysphagia. Patient was seen in emergency room on April 25, 2020 for food impaction. EGD showed lower esophageal stricture with ulceration, possible pill-induced esophagitis, small amount of retained food in the esophagus which was pushed down into the stomach. She was started on pantoprazole 40 mg twice a day.        She was again seen in emergency room on May 10, 2020 for choking sensation. She passed swallow screening and was subsequently discharged back to facility.        According to staff from facility who is with her today, patient is coughing and choking with both solids and liquids. Patient denies any sensation of food getting stuck in esophagus once he swallows it. Denies any abdominal pain. Denies any diarrhea or constipation. Denies any blood in the stool or black stool. Past Medical History:  Diagnosis Date  . Allergic rhinitis   . Asthma   . Cognitive communication deficit   . Dysphagia   . Esophageal stricture     Past Surgical History:  Procedure Laterality Date  . BIOPSY  07/09/2020   Procedure: BIOPSY;  Surgeon: Kathi Der, MD;  Location: [facility];  Service: Gastroenterology;;  . CHOLECYSTECTOMY    . ESOPHAGEAL DILATION  07/09/2020   Procedure: ESOPHAGEAL DILATION;  Surgeon: Kathi Der, MD;  Location: [facility];  Service: Gastroenterology;;  . ESOPHAGOGASTRODUODENOSCOPY (EGD) WITH PROPOFOL N/A 04/25/2020   Procedure: ESOPHAGOGASTRODUODENOSCOPY (EGD) WITH PROPOFOL;  Surgeon: Kathi Der, MD;  Location: [facility];  Service: Gastroenterology;  Laterality: N/A;  . ESOPHAGOGASTRODUODENOSCOPY (EGD) WITH PROPOFOL N/A  07/09/2020   Procedure: ESOPHAGOGASTRODUODENOSCOPY (EGD) WITH PROPOFOL;  Surgeon: Kathi Der, MD;  Location: [facility];  Service: Gastroenterology;  Laterality: N/A;  . FOREIGN BODY REMOVAL  04/25/2020   Procedure: FOREIGN BODY REMOVAL;  Surgeon: Kathi Der, MD;  Location: [facility];  Service: Gastroenterology;;    Prior to Admission medications   Medication Sig Start Date End Date Taking? Authorizing Provider  acetaminophen (TYLENOL) 325 MG tablet Take 650 mg by mouth 2 (two) times daily.   Yes [provider]  acetaminophen (TYLENOL) 500 MG tablet Take 1,000 mg by mouth every 8 (eight) hours as needed for mild pain.   Yes [provider]  albuterol (VENTOLIN HFA) 108 (90 Base) MCG/ACT inhaler Inhale 2 puffs into the lungs 2 (two) times daily. 04/04/20  Yes [provider]  atenolol-chlorthalidone (TENORETIC) 100-25 MG tablet Take 0.5 tablets by mouth daily. 06/14/20  Yes [provider]  azelastine (ASTELIN) 0.1 % nasal spray Place 1 spray into both nostrils 2 (two) times daily. 03/05/20  Yes [provider]  busPIRone (BUSPAR) 10 MG tablet Take 10 mg by mouth 2 (two) times daily. 04/02/20  Yes [provider]  calcium carbonate (TUMS - DOSED IN MG ELEMENTAL CALCIUM) 500 MG chewable tablet Chew 1,000 mg by mouth 2 (two) times daily.   Yes [provider]  Cholecalciferol (VITAMIN D-3) 125 MCG (5000 UT) TABS Take 5,000 Units by mouth daily.   Yes [provider]  dextromethorphan (DELSYM) 30 MG/5ML liquid Take 60 mg by mouth every 12 (twelve) hours as needed for cough.   Yes [provider]  famotidine (PEPCID) 20 MG tablet  Take 20 mg by mouth daily. 06/06/20  Yes [provider]  fexofenadine (ALLEGRA) 180 MG tablet Take 180 mg by mouth daily.   Yes [provider]  fluticasone (FLONASE) 50 MCG/ACT nasal spray Place 1 spray into both nostrils daily. 11/06/13  Yes [provider]  guaiFENesin (MUCINEX) 600 MG 12 hr tablet Take 600 mg by mouth 2 (two) times daily.   Yes [provider]  hydrOXYzine (ATARAX/VISTARIL) 10 MG tablet Take 10 mg by mouth 2 (two) times daily. 03/27/20  Yes [provider]  lamoTRIgine (LAMICTAL) 150 MG tablet Take 150 mg by mouth daily. 04/19/20  Yes [provider]  lidocaine (LIDODERM) 5 % Place 1 patch onto the skin daily. Remove & Discard patch within 12 hours or as directed by MD  TO BILATERAL THIGH   Yes [provider]  LUMIGAN 0.01 % SOLN Place 1 drop into both eyes daily. 04/04/20  Yes [provider]  montelukast (SINGULAIR) 10 MG tablet Take 10 mg by mouth daily. 04/04/20  Yes [provider]  Multiple Vitamin (MULTIVITAMIN WITH MINERALS) TABS tablet Take 1 tablet by mouth daily.   Yes [provider]  oxybutynin (DITROPAN-XL) 5 MG 24 hr tablet Take 5 mg by mouth daily. 04/16/20  Yes [provider]  polyvinyl alcohol (LIQUIFILM TEARS) 1.4 % ophthalmic solution Place 1 drop into both eyes 4 (four) times daily.   Yes [provider]  primidone (MYSOLINE) 250 MG tablet Take 125-250 mg by mouth See admin instructions. 125mg  in the morning 250mg  at bedtime 04/08/20  Yes [provider]  PSYLLIUM FIBER PO Take 0.4 g by mouth 2 (two) times daily.   Yes [provider]  topiramate (TOPAMAX) 50 MG tablet Take 50 mg by mouth daily. 04/02/20  Yes [provider]  pantoprazole (PROTONIX) 40 MG tablet Take 1 tablet (40 mg total) by mouth daily. 07/09/20 09/07/20  07/11/20, MD    Scheduled Meds: Continuous Infusions: PRN Meds:.  Allergies as of 06/10/2020 - Review Complete 05/10/2020  Allergen Reaction Noted  . Codeine Nausea And Vomiting 02/06/2014    Family History  Problem Relation Age of Onset  . Asthma Maternal Aunt     Social History   Socioeconomic History  . Marital status: Divorced    Spouse name: Not on file  .  Number of children: Not on file  . Years of education: Not on file  . Highest education level: Not on file  Occupational History  . Not on file  Tobacco Use  . Smoking status: Never Smoker  . Smokeless tobacco: Never Used  Vaping Use  . Vaping Use: Never used  Substance and Sexual Activity  . Alcohol use: Never  . Drug use: Never  . Sexual activity: Not on file  Other Topics Concern  . Not on file  Social History Narrative  . Not on file   Social Determinants of Health   Financial Resource Strain: Not on file  Food Insecurity: Not on file  Transportation Needs: Not on file  Physical Activity: Not on file  Stress: Not on file  Social Connections: Not on file  Intimate Partner Violence: Not on file    Review of Systems:   Physical Exam: Vital signs: Vitals:   07/09/20 0927 07/09/20 0938  BP: (!) 182/73 (!) 157/84  Pulse: 85 75  Resp: (!) 24 16  Temp:    SpO2: 99% 98%     General:   Alert,  Well-developed,  well-nourished, pleasant and cooperative in NAD Lungs:  No Visible respiratory distress. Heart:  Regular rate and rhythm; no murmurs, clicks, rubs,  or gallops. Abdomen: soft, nontender, nondistended, bowel sounds present. Rectal:  Deferred  GI:  Lab Results: No results for input(s): WBC, HGB, HCT, PLT in the last 72 hours. BMET No results for input(s): NA, K, CL, CO2, GLUCOSE, BUN, CREATININE, CALCIUM in the last 72 hours. LFT No results for input(s): PROT, ALBUMIN, AST, ALT, ALKPHOS, BILITOT, BILIDIR, IBILI in the last 72 hours. PT/INR No results for input(s): LABPROT, INR in the last 72 hours.   Studies/Results: No results found.  Impression/Plan: - nonspecific dysphagia - History of esophageal stricture and esophagitis along with esophageal ulcer.  Recommendations ------------------------- - Proceed with EGD today.  Risks (bleeding, infection, bowel perforation that could require surgery, sedation-related changes in cardiopulmonary systems),  benefits (identification and possible treatment of source of symptoms, exclusion of certain causes of symptoms), and alternatives (watchful waiting, radiographic imaging studies, empiric medical treatment)  were explained to patient/family in detail and patient wishes to proceed.    LOS: 0 days   Kathi Der  MD, FACP 07/26/2020, 8:48 AM  Contact #  [phone]

## 2020-11-04 ENCOUNTER — Ambulatory Visit (INDEPENDENT_AMBULATORY_CARE_PROVIDER_SITE_OTHER): Payer: Medicare Other | Admitting: Pulmonary Disease

## 2020-11-04 ENCOUNTER — Encounter: Payer: Self-pay | Admitting: Pulmonary Disease

## 2020-11-04 ENCOUNTER — Other Ambulatory Visit: Payer: Self-pay

## 2020-11-04 VITALS — BP 120/70 | HR 68 | Temp 98.4°F | Ht 66.0 in | Wt 266.0 lb

## 2020-11-04 DIAGNOSIS — R053 Chronic cough: Secondary | ICD-10-CM

## 2020-11-04 MED ORDER — ALBUTEROL SULFATE HFA 108 (90 BASE) MCG/ACT IN AERS: 2.0000 | INHALATION_SPRAY | Freq: Two times a day (BID) | RESPIRATORY_TRACT | 12 refills | Status: DC

## 2020-11-04 NOTE — Progress Notes (Signed)
Subjective:   PATIENT ID: Erica Griffin, MRN: [id number]   HPI  Chief Complaint  Patient presents with   Follow-up    Pt states being doing better with medication but have a cough for a week.     Reason for Visit: Chronic cough  [name] is a 70 year old female never smoker with GERD, hx of esophageal ulcer and Schatzki ring s/p dilation, hx COVID-19 2020 who presents as new a patient for chronic cough.  Synopsis: She reports chronic cough usually during eating. Since February she has had three episodes of ED visit involving choking. Every time she eats she coughs. Even when not eating she will have coughing spells. Not associated with swallowing saliva. Cough is non productive. She recently underwent an upper EGD and found with non-obstructing Schatzki ring was found at the gastroesophageal junction that was dilated on 07/09/20. Her caregiver reports that it seems worse than her procedure two days ago. Sometimes wheezing. Denies allergies. No nasal congestion, sinus issue. She does report a history of asthma that she uses a rescue inhaler twice a day. Denies fevers, chills.  11/04/20 - Caretaker present and provided additional history Since our last visit she was unable to complete PFTs due to difficulty understanding directions. She has been compliant with her albuterol and protonix. She uses Albuterol twice a day and feels this has helped her cough significantly. Last episode of coughing was last week. She follows aspiration precautions.  Social History: Never smoker   Past Medical History:  Diagnosis Date   Allergic rhinitis    Asthma    Cognitive communication deficit    Dysphagia    Esophageal stricture      Allergies  Allergen Reactions   Codeine Nausea And Vomiting     Outpatient Medications Prior to Visit  Medication Sig Dispense Refill   acetaminophen (TYLENOL) 325 MG tablet Take 650 mg by mouth 2 (two) times daily.      acetaminophen (TYLENOL) 500 MG tablet Take 1,000 mg by mouth every 8 (eight) hours as needed for mild pain.     albuterol (VENTOLIN HFA) 108 (90 Base) MCG/ACT inhaler Inhale 2 puffs into the lungs 2 (two) times daily.     atenolol-chlorthalidone (TENORETIC) 100-25 MG tablet Take 0.5 tablets by mouth daily.     azelastine (ASTELIN) 0.1 % nasal spray Place 1 spray into both nostrils 2 (two) times daily.     busPIRone (BUSPAR) 10 MG tablet Take 10 mg by mouth 2 (two) times daily.     calcium carbonate (TUMS - DOSED IN MG ELEMENTAL CALCIUM) 500 MG chewable tablet Chew 1,000 mg by mouth 2 (two) times daily.     Cholecalciferol (VITAMIN D-3) 125 MCG (5000 UT) TABS Take 5,000 Units by mouth daily.     dextromethorphan (DELSYM) 30 MG/5ML liquid Take 60 mg by mouth every 12 (twelve) hours as needed for cough.     famotidine (PEPCID) 20 MG tablet Take 20 mg by mouth daily.     fexofenadine (ALLEGRA) 180 MG tablet Take 180 mg by mouth daily.     fluticasone (FLONASE) 50 MCG/ACT nasal spray Place 1 spray into both nostrils daily.     guaiFENesin (MUCINEX) 600 MG 12 hr tablet Take 600 mg by mouth 2 (two) times daily.     hydrOXYzine (ATARAX/VISTARIL) 10 MG tablet Take 10 mg by mouth 2 (two) times daily.     lamoTRIgine (LAMICTAL) 150 MG tablet Take 150 mg  by mouth daily.     lidocaine (LIDODERM) 5 % Place 1 patch onto the skin daily. Remove & Discard patch within 12 hours or as directed by MD  TO BILATERAL THIGH     LUMIGAN 0.01 % SOLN Place 1 drop into both eyes daily.     montelukast (SINGULAIR) 10 MG tablet Take 10 mg by mouth daily.     Multiple Vitamin (MULTIVITAMIN WITH MINERALS) TABS tablet Take 1 tablet by mouth daily.     oxybutynin (DITROPAN-XL) 5 MG 24 hr tablet Take 5 mg by mouth daily.     polyvinyl alcohol (LIQUIFILM TEARS) 1.4 % ophthalmic solution Place 1 drop into both eyes 4 (four) times daily.     primidone (MYSOLINE) 250 MG tablet Take 125-250 mg by mouth See admin instructions. 125mg  in  the morning 250mg  at bedtime     PSYLLIUM FIBER PO Take 0.4 g by mouth 2 (two) times daily.     topiramate (TOPAMAX) 50 MG tablet Take 50 mg by mouth daily.     pantoprazole (PROTONIX) 40 MG tablet Take 1 tablet (40 mg total) by mouth daily. 60 tablet 2   No facility-administered medications prior to visit.    Review of Systems  Constitutional:  Negative for chills, diaphoresis, fever, malaise/fatigue and weight loss.  HENT:  Negative for congestion.   Respiratory:  Positive for cough. Negative for hemoptysis, sputum production, shortness of breath and wheezing.   Cardiovascular:  Negative for chest pain, palpitations and leg swelling.    Objective:   Vitals:   11/04/20 1042  BP: 120/70  Pulse: 68  Temp: 98.4 F (36.9 C)  TempSrc: Oral  SpO2: 96%  Weight: 266 lb (120.7 kg)  Height: 5\' 6"  (1.676 m)   SpO2: 96 % O2 Device: None (Room air)  Physical Exam: General: Well-appearing, no acute distress, intellectual disability HENT: [place], AT Eyes: EOMI, no scleral icterus Respiratory: Clear to auscultation bilaterally.  No crackles, wheezing or rales Cardiovascular: RRR, -M/R/G, no JVD Extremities:-Edema,-tenderness Neuro: AAO x4, CNII-XII grossly intact Psych: Normal mood, normal affect   Data Reviewed:  Imaging: CXR 06/20/20 - Small lung volumes with bibasilar atelectasis. No effusion, edema or infiltrate  PFT: None on file  Labs: CBC    Component Value Date/Time   WBC 8.6 04/25/2020 1210   RBC 4.37 04/25/2020 1210   HGB 13.8 04/25/2020 1210   HCT 41.4 04/25/2020 1210   PLT 297 04/25/2020 1210   MCV 94.7 04/25/2020 1210   MCH 31.6 04/25/2020 1210   MCHC 33.3 04/25/2020 1210   RDW 14.3 04/25/2020 1210   LYMPHSABS 1.6 04/25/2020 1210   MONOABS 0.7 04/25/2020 1210   EOSABS 0.1 04/25/2020 1210   BASOSABS 0.1 04/25/2020 1210   Absolute eos 07/11/20 100     Assessment & Plan:   Discussion: 70 year old female wheelchair-bound with intellectual disability who  presents for chronic cough.  Caretaker present.  Cough is improved with short acting beta agonist.  She is happy with her care  Chronic cough, suspect secondary to reflux +/- aspiration - improved/resolved Unable to complete PFTs --CONTINUE Albuterol two puffs twice a day --CONTINUE protonix 1-2 times a day  --Aspiration precautions  Health Maintenance Immunization History  Administered Date(s) Administered   Moderna SARS-COV2 Booster Vaccination 02/01/2020   Moderna Sars-Covid-2 Vaccination 03/13/2019, 04/10/2019     No orders of the defined types were placed in this encounter. Meds ordered this encounter  Medications   albuterol (VENTOLIN HFA) 108 (90 Base) MCG/ACT inhaler  Sig: Inhale 2 puffs into the lungs 2 (two) times daily.    Dispense:  8 g    Refill:  12    Return in about 1 year (around 11/04/2021).   Luciano Cinquemani Mechele Collin, MD [facility] 11/04/2020 10:47 AM  Office Number [phone]

## 2020-11-04 NOTE — Patient Instructions (Addendum)
--  CONTINUE Albuterol two puffs twice a day --CONTINUE protonix 1-2 times a day  --Aspiration precautions  Follow-up with me in 1 year

## 2020-11-07 ENCOUNTER — Encounter: Payer: Self-pay | Admitting: Pulmonary Disease

## 2020-12-13 ENCOUNTER — Telehealth (HOSPITAL_COMMUNITY): Payer: Self-pay

## 2020-12-13 NOTE — Telephone Encounter (Signed)
Attempted to contact [facility] facility to schedule patient for OP MBS - left voicemail.

## 2020-12-20 ENCOUNTER — Other Ambulatory Visit (HOSPITAL_COMMUNITY): Payer: Self-pay

## 2020-12-20 DIAGNOSIS — R1310 Dysphagia, unspecified: Secondary | ICD-10-CM

## 2020-12-20 DIAGNOSIS — R059 Cough, unspecified: Secondary | ICD-10-CM

## 2020-12-25 ENCOUNTER — Other Ambulatory Visit: Payer: Self-pay

## 2020-12-25 ENCOUNTER — Ambulatory Visit (HOSPITAL_COMMUNITY)
Admission: RE | Admit: 2020-12-25 | Discharge: 2020-12-25 | Disposition: A | Discharge status: Home / Self Care | Payer: Medicare Other | Source: Ambulatory Visit | Attending: Gastroenterology | Admitting: Gastroenterology

## 2020-12-25 DIAGNOSIS — R059 Cough, unspecified: Secondary | ICD-10-CM | POA: Diagnosis present

## 2020-12-25 DIAGNOSIS — R1312 Dysphagia, oropharyngeal phase: Secondary | ICD-10-CM | POA: Insufficient documentation

## 2020-12-25 DIAGNOSIS — R1310 Dysphagia, unspecified: Secondary | ICD-10-CM | POA: Diagnosis present

## 2020-12-25 NOTE — Progress Notes (Signed)
   Modified Barium Swallow Progress Note  Patient Details  Name: Erica Griffin MRN: [id number] Date of Birth: 08-21-1950  Today's Date: 12/25/2020  Modified Barium Swallow completed.  Full report located under Chart Review in the Imaging Section.  Brief recommendations include the following:  Clinical Impression  Pt was seen in radiology suite for modified barium swallow study. Trials of puree solids, regular texture solids, a 68mm barium tablet, and individual and consecutive swallows of thin liquids via cup and straw were administered. Pt's oropharyngeal swallow mechanism was within functional limits. No episodes of penetration or aspiration were noted and oropharyngeal clearance was WNL. Coughing was noted during and after the study but no study; however, no instances of penetration or aspiration were demonstrated to suggest relatedness of coughing to swallow function. A regular texture diet with thin liquids is recommended at this time.   Swallow Evaluation Recommendations       SLP Diet Recommendations: Regular solids;Thin liquid       Medication Administration: Whole meds with liquid   Supervision: Staff to assist with self feeding           Oral Care Recommendations: Oral care BID      Shay Bartoli I. Vear Clock, MS, CCC-SLP [facility] Office number [phone] Pager [phone]   Scheryl Marten 12/25/2020,5:28 PM

## 2021-11-19 ENCOUNTER — Ambulatory Visit (INDEPENDENT_AMBULATORY_CARE_PROVIDER_SITE_OTHER): Payer: Medicare Other | Admitting: Pulmonary Disease

## 2021-11-19 ENCOUNTER — Encounter: Payer: Self-pay | Admitting: Pulmonary Disease

## 2021-11-19 VITALS — BP 128/60 | HR 66 | Wt 293.2 lb

## 2021-11-19 DIAGNOSIS — R053 Chronic cough: Secondary | ICD-10-CM | POA: Diagnosis not present

## 2021-11-19 MED ORDER — ALBUTEROL SULFATE HFA 108 (90 BASE) MCG/ACT IN AERS: 2.0000 | INHALATION_SPRAY | Freq: Two times a day (BID) | RESPIRATORY_TRACT | 11 refills | Status: AC

## 2021-11-19 NOTE — Patient Instructions (Addendum)
Chronic cough, suspect secondary to reflux +/- aspiration - improved Unable to complete PFTs --CONTINUE Albuterol TWO  puffs twice a day --CONTINUE protonix 1-2 times a day  --Aspiration precautions  Follow-up with me in 6 months

## 2021-11-19 NOTE — Progress Notes (Signed)
Subjective:   PATIENT ID: Erica Griffin GENDER: female DOB: [birth year], MRN: [id number]   HPI  Chief Complaint  Patient presents with   Follow-up    cough    Reason for Visit: Chronic cough  [name] is a 71 year old female never smoker with GERD, hx of esophageal ulcer and Schatzki ring s/p dilation, hx COVID-19 2020 who presents for follow-up  Synopsis: She reports chronic cough usually during eating. Since February she has had three episodes of ED visit involving choking. Every time she eats she coughs. Even when not eating she will have coughing spells. Not associated with swallowing saliva. Cough is non productive. She recently underwent an upper EGD and found with non-obstructing Schatzki ring was found at the gastroesophageal junction that was dilated on 07/09/20. Her caregiver reports that it seems worse than her procedure two days ago. Sometimes wheezing. Denies allergies. No nasal congestion, sinus issue. She does report a history of asthma that she uses a rescue inhaler twice a day. Denies fevers, chills.  11/04/20 - Caretaker present and provided additional history Since our last visit she was unable to complete PFTs due to difficulty understanding directions. She has been compliant with her albuterol and protonix. She uses Albuterol twice a day and feels this has helped her cough significantly. Last episode of coughing was last week. She follows aspiration precautions.  11/19/21 - Caregiver present and provides additional history. She reports dry cough that usually improves with albuterol. Reflux worsens with her cough. However she ran out of albuterol and requested from our office however it has been over one year since our last visit so schedule visit.   Social History: Never smoker   Past Medical History:  Diagnosis Date   Allergic rhinitis    Asthma    Cognitive communication deficit    Dysphagia    Esophageal stricture      Allergies  Allergen Reactions    Codeine Nausea And Vomiting     Outpatient Medications Prior to Visit  Medication Sig Dispense Refill   acetaminophen (TYLENOL) 325 MG tablet Take 650 mg by mouth 2 (two) times daily.     acetaminophen (TYLENOL) 500 MG tablet Take 1,000 mg by mouth every 8 (eight) hours as needed for mild pain.     albuterol (VENTOLIN HFA) 108 (90 Base) MCG/ACT inhaler Inhale 2 puffs into the lungs 2 (two) times daily. 8 g 12   atenolol-chlorthalidone (TENORETIC) 100-25 MG tablet Take 0.5 tablets by mouth daily.     azelastine (ASTELIN) 0.1 % nasal spray Place 1 spray into both nostrils 2 (two) times daily.     busPIRone (BUSPAR) 10 MG tablet Take 10 mg by mouth 2 (two) times daily.     Cholecalciferol (VITAMIN D-3) 125 MCG (5000 UT) TABS Take 5,000 Units by mouth daily.     dextromethorphan (DELSYM) 30 MG/5ML liquid Take 60 mg by mouth every 12 (twelve) hours as needed for cough.     fluticasone (FLONASE) 50 MCG/ACT nasal spray Place 1 spray into both nostrils daily.     lamoTRIgine (LAMICTAL) 150 MG tablet Take 150 mg by mouth daily.     montelukast (SINGULAIR) 10 MG tablet Take 10 mg by mouth daily.     Multiple Vitamin (MULTIVITAMIN WITH MINERALS) TABS tablet Take 1 tablet by mouth daily.     ARTIFICIAL TEAR INSERT OP  (Patient not taking: Reported on 11/19/2021)     calcium carbonate (TUMS - DOSED IN MG ELEMENTAL CALCIUM)  500 MG chewable tablet Chew 1,000 mg by mouth 2 (two) times daily. (Patient not taking: Reported on 11/19/2021)     famotidine (PEPCID) 20 MG tablet Take 20 mg by mouth daily. (Patient not taking: Reported on 11/19/2021)     fexofenadine (ALLEGRA) 180 MG tablet Take 180 mg by mouth daily. (Patient not taking: Reported on 11/19/2021)     guaiFENesin (MUCINEX) 600 MG 12 hr tablet Take 600 mg by mouth 2 (two) times daily. (Patient not taking: Reported on 11/19/2021)     hydrOXYzine (ATARAX/VISTARIL) 10 MG tablet Take 10 mg by mouth 2 (two) times daily. (Patient not taking: Reported on 11/19/2021)      lidocaine (LIDODERM) 5 % Place 1 patch onto the skin daily. Remove & Discard patch within 12 hours or as directed by MD  TO BILATERAL THIGH (Patient not taking: Reported on 11/19/2021)     LUMIGAN 0.01 % SOLN Place 1 drop into both eyes daily. (Patient not taking: Reported on 11/19/2021)     oxybutynin (DITROPAN-XL) 5 MG 24 hr tablet Take 5 mg by mouth daily. (Patient not taking: Reported on 11/19/2021)     pantoprazole (PROTONIX) 40 MG tablet Take 1 tablet (40 mg total) by mouth daily. 60 tablet 2   polyvinyl alcohol (LIQUIFILM TEARS) 1.4 % ophthalmic solution Place 1 drop into both eyes 4 (four) times daily. (Patient not taking: Reported on 11/19/2021)     primidone (MYSOLINE) 250 MG tablet Take 125-250 mg by mouth See admin instructions. 125mg  in the morning 250mg  at bedtime (Patient not taking: Reported on 11/19/2021)     PSYLLIUM FIBER PO Take 0.4 g by mouth 2 (two) times daily. (Patient not taking: Reported on 11/19/2021)     topiramate (TOPAMAX) 50 MG tablet Take 50 mg by mouth daily. (Patient not taking: Reported on 11/19/2021)     No facility-administered medications prior to visit.    Review of Systems  Constitutional:  Negative for chills, diaphoresis, fever, malaise/fatigue and weight loss.  HENT:  Negative for congestion.   Respiratory:  Positive for cough. Negative for hemoptysis, sputum production, shortness of breath and wheezing.   Cardiovascular:  Negative for chest pain, palpitations and leg swelling.  Gastrointestinal:  Positive for heartburn.     Objective:   Vitals:   11/19/21 1445  BP: 128/60  Pulse: 66  SpO2: 94%  Weight: 293 lb 3.2 oz (133 kg)   SpO2: 94 % O2 Device: None (Room air)  Physical Exam: General: Well-appearing, no acute distress HENT: [place], AT Eyes: EOMI, no scleral icterus Respiratory: Clear to auscultation bilaterally.  No crackles, wheezing or rales Cardiovascular: RRR, -M/R/G, no JVD Extremities:-Edema,-tenderness Neuro: AAO x4, CNII-XII grossly  intact Psych: Normal mood, normal affec   Data Reviewed:  Imaging: CXR 06/20/20 - Small lung volumes with bibasilar atelectasis. No effusion, edema or infiltrate  PFT: None on file  Labs: CBC    Component Value Date/Time   WBC 8.6 04/25/2020 1210   RBC 4.37 04/25/2020 1210   HGB 13.8 04/25/2020 1210   HCT 41.4 04/25/2020 1210   PLT 297 04/25/2020 1210   MCV 94.7 04/25/2020 1210   MCH 31.6 04/25/2020 1210   MCHC 33.3 04/25/2020 1210   RDW 14.3 04/25/2020 1210   LYMPHSABS 1.6 04/25/2020 1210   MONOABS 0.7 04/25/2020 1210   EOSABS 0.1 04/25/2020 1210   BASOSABS 0.1 04/25/2020 1210   Absolute eos 07/11/20 100     Assessment & Plan:   Discussion: 71 year old female wheelchair bound  with intellectual disability who presents for follow-up. Cough recurs off SABA. Improved when on inhalers. Needing refills  Chronic cough, suspect secondary to reflux +/- aspiration - uncontrolled until restarted on SABA Unable to complete PFTs --CONTINUE Albuterol TWO  puffs twice a day --CONTINUE protonix 1-2 times a day  --Aspiration precautions  Health Maintenance Immunization History  Administered Date(s) Administered   Moderna SARS-COV2 Booster Vaccination 02/01/2020   Moderna Sars-Covid-2 Vaccination 03/13/2019, 04/10/2019     No orders of the defined types were placed in this encounter.  Meds ordered this encounter  Medications   albuterol (VENTOLIN HFA) 108 (90 Base) MCG/ACT inhaler    Sig: Inhale 2 puffs into the lungs 2 (two) times daily.    Dispense:  8 g    Refill:  11    Return in about 6 months (around 05/20/2022).  I have spent a total time of 25-minutes on the day of the appointment including chart review, data review, collecting history, coordinating care and discussing medical diagnosis and plan with the patient/family. Past medical history, allergies, medications were reviewed. Pertinent imaging, labs and tests included in this note have been reviewed and  interpreted independently by me.  Eliany Mccarter Mechele Collin, MD [facility] 11/19/2021 3:09 PM  Office Number [phone]

## 2022-05-22 ENCOUNTER — Ambulatory Visit (INDEPENDENT_AMBULATORY_CARE_PROVIDER_SITE_OTHER): Payer: Medicare Other | Admitting: Pulmonary Disease

## 2022-05-22 ENCOUNTER — Encounter (HOSPITAL_BASED_OUTPATIENT_CLINIC_OR_DEPARTMENT_OTHER): Payer: Self-pay | Admitting: Pulmonary Disease

## 2022-05-22 VITALS — BP 110/60 | HR 64 | Wt 278.6 lb

## 2022-05-22 DIAGNOSIS — R053 Chronic cough: Secondary | ICD-10-CM

## 2022-05-22 MED ORDER — PANTOPRAZOLE SODIUM 40 MG PO TBEC: 40.0000 mg | DELAYED_RELEASE_TABLET | Freq: Two times a day (BID) | ORAL | 5 refills | Status: DC

## 2022-05-22 NOTE — Patient Instructions (Signed)
Chronic cough, suspect secondary to reflux +/- aspiration - improved on inhalers. Triggered by reflux Unable to complete PFTs --CONTINUE Albuterol TWO  puffs twice a day --CONTINUE protonix 2 times a day  --Aspiration precautions  Follow-up with me in 6 months

## 2022-05-22 NOTE — Progress Notes (Signed)
Subjective:   PATIENT ID: Erica Griffin GENDER: female DOB: [birth year], MRN: [id number]   HPI  Chief Complaint  Patient presents with   Follow-up    Still has cough driving her crazy    Reason for Visit: Chronic cough  Erica Griffin is a 72 year old female never smoker with GERD, hx of esophageal ulcer and Schatzki ring s/p dilation, hx COVID-19 2020 who presents for follow-up  Synopsis: She reports chronic cough usually during eating. Since February she has had three episodes of ED visit involving choking. Every time she eats she coughs. Even when not eating she will have coughing spells. Not associated with swallowing saliva. Cough is non productive. She recently underwent an upper EGD and found with non-obstructing Schatzki ring was found at the gastroesophageal junction that was dilated on 07/09/20. Her caregiver reports that it seems worse than her procedure two days ago. Sometimes wheezing. Denies allergies. No nasal congestion, sinus issue. She does report a history of asthma that she uses a rescue inhaler twice a day. Denies fevers, chills.  11/04/20 - Caretaker present and provided additional history Since our last visit she was unable to complete PFTs due to difficulty understanding directions. She has been compliant with her albuterol and protonix. She uses Albuterol twice a day and feels this has helped her cough significantly. Last episode of coughing was last week. She follows aspiration precautions.  11/19/21 - Caregiver present and provides additional history. She reports dry cough that usually improves with albuterol. Reflux worsens with her cough. However she ran out of albuterol and requested from our office however it has been over one year since our last visit so schedule visit.   05/22/22 - No caregiver present today. History limited due to intellectual disability. Compliant albuterol twice a day, protonix twice a day, montelukast and nasal sprays. Cough overall  unchanged. Continues to have breakthrough reflux. She reports she recently had the flu but this is improved. Previously had wheezing but this has resolved.   Social History: Never smoker   Past Medical History:  Diagnosis Date   Allergic rhinitis    Asthma    Cognitive communication deficit    Dysphagia    Esophageal stricture      Allergies  Allergen Reactions   Codeine Nausea And Vomiting     Outpatient Medications Prior to Visit  Medication Sig Dispense Refill   acetaminophen (TYLENOL) 325 MG tablet Take 650 mg by mouth 2 (two) times daily.     acetaminophen (TYLENOL) 500 MG tablet Take 1,000 mg by mouth every 8 (eight) hours as needed for mild pain.     albuterol (VENTOLIN HFA) 108 (90 Base) MCG/ACT inhaler Inhale 2 puffs into the lungs 2 (two) times daily. 8 g 11   ARTIFICIAL TEAR INSERT OP      atenolol-chlorthalidone (TENORETIC) 100-25 MG tablet Take 0.5 tablets by mouth daily.     atorvastatin (LIPITOR) 10 MG tablet Take 10 mg by mouth daily.     azelastine (ASTELIN) 0.1 % nasal spray Place 1 spray into both nostrils 2 (two) times daily.     busPIRone (BUSPAR) 10 MG tablet Take 10 mg by mouth 2 (two) times daily.     Cholecalciferol (VITAMIN D-3) 125 MCG (5000 UT) TABS Take 5,000 Units by mouth daily.     dextromethorphan (DELSYM) 30 MG/5ML liquid Take 60 mg by mouth every 12 (twelve) hours as needed for cough.     doxepin (SINEQUAN) 10 MG capsule Take  10 mg by mouth at bedtime.     escitalopram (LEXAPRO) 10 MG tablet Take by mouth.     fluticasone (FLONASE) 50 MCG/ACT nasal spray Place 1 spray into both nostrils daily.     guaiFENesin (MUCINEX) 600 MG 12 hr tablet Take 600 mg by mouth 2 (two) times daily.     lamoTRIgine (LAMICTAL) 150 MG tablet Take 150 mg by mouth daily.     lidocaine (LIDODERM) 5 % Place 1 patch onto the skin daily. Remove & Discard patch within 12 hours or as directed by MD  TO BILATERAL THIGH     LUMIGAN 0.01 % SOLN Place 1 drop into both eyes  daily.     montelukast (SINGULAIR) 10 MG tablet Take 10 mg by mouth daily.     Multiple Vitamin (MULTIVITAMIN WITH MINERALS) TABS tablet Take 1 tablet by mouth daily.     nystatin ointment (MYCOSTATIN) SMARTSIG:1 Topical Daily     oxybutynin (DITROPAN-XL) 5 MG 24 hr tablet Take 5 mg by mouth daily.     polyvinyl alcohol (LIQUIFILM TEARS) 1.4 % ophthalmic solution Place 1 drop into both eyes 4 (four) times daily.     primidone (MYSOLINE) 250 MG tablet Take 125-250 mg by mouth See admin instructions. '125mg'$  in the morning '250mg'$  at bedtime     PSYLLIUM FIBER PO Take 0.4 g by mouth 2 (two) times daily.     spironolactone (ALDACTONE) 25 MG tablet Take 25 mg by mouth daily.     topiramate (TOPAMAX) 50 MG tablet Take 50 mg by mouth daily.     calcium carbonate (TUMS - DOSED IN MG ELEMENTAL CALCIUM) 500 MG chewable tablet Chew 1,000 mg by mouth 2 (two) times daily.     famotidine (PEPCID) 20 MG tablet Take 20 mg by mouth daily. (Patient not taking: Reported on 11/19/2021)     fexofenadine (ALLEGRA) 180 MG tablet Take 180 mg by mouth daily. (Patient not taking: Reported on 11/19/2021)     hydrOXYzine (ATARAX/VISTARIL) 10 MG tablet Take 10 mg by mouth 2 (two) times daily. (Patient not taking: Reported on 11/19/2021)     pantoprazole (PROTONIX) 40 MG tablet Take 1 tablet (40 mg total) by mouth daily. 60 tablet 2   No facility-administered medications prior to visit.    Review of Systems  Constitutional:  Negative for chills, diaphoresis, fever, malaise/fatigue and weight loss.  HENT:  Negative for congestion.   Respiratory:  Positive for cough. Negative for hemoptysis, sputum production, shortness of breath and wheezing.   Cardiovascular:  Negative for chest pain, palpitations and leg swelling.  Gastrointestinal:  Positive for heartburn.     Objective:   Vitals:   05/22/22 1310  BP: 110/60  Pulse: 64  SpO2: 95%  Weight: 278 lb 9.6 oz (126.4 kg)   SpO2: 95 % O2 Device: None (Room air)  Physical  Exam: General: Well-appearing, no acute distress, intellectual disability HENT: [place], AT Eyes: EOMI, no scleral icterus Respiratory: Clear to auscultation bilaterally.  No crackles, wheezing or rales Cardiovascular: RRR, -M/R/G, no JVD Extremities:-Edema,-tenderness Neuro: AAO x4, CNII-XII grossly intact, slurred speech at baseline Psych: Normal mood, normal affect  Data Reviewed:  Imaging: CXR 06/20/20 - Small lung volumes with bibasilar atelectasis. No effusion, edema or infiltrate  PFT: None on file  Labs: CBC    Component Value Date/Time   WBC 8.6 04/25/2020 1210   RBC 4.37 04/25/2020 1210   HGB 13.8 04/25/2020 1210   HCT 41.4 04/25/2020 1210   PLT 297 04/25/2020  1210   MCV 94.7 04/25/2020 1210   MCH 31.6 04/25/2020 1210   MCHC 33.3 04/25/2020 1210   RDW 14.3 04/25/2020 1210   LYMPHSABS 1.6 04/25/2020 1210   MONOABS 0.7 04/25/2020 1210   EOSABS 0.1 04/25/2020 1210   BASOSABS 0.1 04/25/2020 1210   Absolute eos 07/11/20 100     Assessment & Plan:   Discussion: 72 year old female wheelchairbound female with intellectual disability who presents for follow-up. Cough unchanged but notably recurs off SABA. Has breakthrough reflux likely contributing  Chronic cough, suspect secondary to reflux +/- aspiration - improved on inhalers. Triggered by reflux Unable to complete PFTs --CONTINUE Albuterol TWO  puffs twice a day --CONTINUE protonix 2 times a day  --Aspiration precautions  Health Maintenance Immunization History  Administered Date(s) Administered   Moderna SARS-COV2 Booster Vaccination 02/01/2020   Moderna Sars-Covid-2 Vaccination 03/13/2019, 04/10/2019     No orders of the defined types were placed in this encounter.  Meds ordered this encounter  Medications   pantoprazole (PROTONIX) 40 MG tablet    Sig: Take 1 tablet (40 mg total) by mouth 2 (two) times daily.    Dispense:  60 tablet    Refill:  5    Return in about 6 months (around 11/22/2022).  I  have spent a total time of 25-minutes on the day of the appointment including chart review, data review, collecting history, coordinating care and discussing medical diagnosis and plan with the patient/family. Past medical history, allergies, medications were reviewed. Pertinent imaging, labs and tests included in this note have been reviewed and interpreted independently by me.  [place], MD [facility] 05/22/2022 1:37 PM  Office Number [phone]

## 2022-07-20 ENCOUNTER — Emergency Department (HOSPITAL_COMMUNITY): Payer: Medicare Other

## 2022-07-20 ENCOUNTER — Encounter (HOSPITAL_COMMUNITY): Payer: Self-pay

## 2022-07-20 ENCOUNTER — Observation Stay (HOSPITAL_COMMUNITY)
Admission: EM | Admit: 2022-07-20 | Discharge: 2022-07-23 | Disposition: A | Discharge status: Skilled Nursing Facility | Payer: Medicare Other | Attending: Internal Medicine | Admitting: Internal Medicine

## 2022-07-20 DIAGNOSIS — R2681 Unsteadiness on feet: Secondary | ICD-10-CM | POA: Diagnosis not present

## 2022-07-20 DIAGNOSIS — X58XXXA Exposure to other specified factors, initial encounter: Secondary | ICD-10-CM | POA: Insufficient documentation

## 2022-07-20 DIAGNOSIS — I10 Essential (primary) hypertension: Secondary | ICD-10-CM | POA: Insufficient documentation

## 2022-07-20 DIAGNOSIS — J9601 Acute respiratory failure with hypoxia: Secondary | ICD-10-CM | POA: Diagnosis not present

## 2022-07-20 DIAGNOSIS — K219 Gastro-esophageal reflux disease without esophagitis: Secondary | ICD-10-CM | POA: Diagnosis not present

## 2022-07-20 DIAGNOSIS — Z79899 Other long term (current) drug therapy: Secondary | ICD-10-CM | POA: Insufficient documentation

## 2022-07-20 DIAGNOSIS — K2210 Ulcer of esophagus without bleeding: Secondary | ICD-10-CM | POA: Insufficient documentation

## 2022-07-20 DIAGNOSIS — K222 Esophageal obstruction: Secondary | ICD-10-CM | POA: Diagnosis not present

## 2022-07-20 DIAGNOSIS — G40909 Epilepsy, unspecified, not intractable, without status epilepticus: Secondary | ICD-10-CM | POA: Diagnosis not present

## 2022-07-20 DIAGNOSIS — E876 Hypokalemia: Secondary | ICD-10-CM | POA: Insufficient documentation

## 2022-07-20 DIAGNOSIS — K449 Diaphragmatic hernia without obstruction or gangrene: Secondary | ICD-10-CM | POA: Diagnosis not present

## 2022-07-20 DIAGNOSIS — T18108A Unspecified foreign body in esophagus causing other injury, initial encounter: Secondary | ICD-10-CM | POA: Diagnosis present

## 2022-07-20 DIAGNOSIS — M6281 Muscle weakness (generalized): Secondary | ICD-10-CM | POA: Insufficient documentation

## 2022-07-20 DIAGNOSIS — R1310 Dysphagia, unspecified: Secondary | ICD-10-CM | POA: Diagnosis not present

## 2022-07-20 LAB — CBC WITH DIFFERENTIAL/PLATELET
Abs Immature Granulocytes: 0.04 10*3/uL (ref 0.00–0.07)
Basophils Absolute: 0.1 10*3/uL (ref 0.0–0.1)
Basophils Relative: 1 %
Eosinophils Absolute: 0.3 10*3/uL (ref 0.0–0.5)
Eosinophils Relative: 4 %
HCT: 40.4 % (ref 36.0–46.0)
Hemoglobin: 13.1 g/dL (ref 12.0–15.0)
Immature Granulocytes: 1 %
Lymphocytes Relative: 15 %
Lymphs Abs: 1.2 10*3/uL (ref 0.7–4.0)
MCH: 31.1 pg (ref 26.0–34.0)
MCHC: 32.4 g/dL (ref 30.0–36.0)
MCV: 96 fL (ref 80.0–100.0)
Monocytes Absolute: 0.9 10*3/uL (ref 0.1–1.0)
Monocytes Relative: 11 %
Neutro Abs: 5.4 10*3/uL (ref 1.7–7.7)
Neutrophils Relative %: 68 %
Platelets: 289 10*3/uL (ref 150–400)
RBC: 4.21 MIL/uL (ref 3.87–5.11)
RDW: 14.6 % (ref 11.5–15.5)
WBC: 8 10*3/uL (ref 4.0–10.5)
nRBC: 0 % (ref 0.0–0.2)

## 2022-07-20 LAB — COMPREHENSIVE METABOLIC PANEL
ALT: 17 U/L (ref 0–44)
AST: 19 U/L (ref 15–41)
Albumin: 3.6 g/dL (ref 3.5–5.0)
Alkaline Phosphatase: 122 U/L (ref 38–126)
Anion gap: 9 (ref 5–15)
BUN: 11 mg/dL (ref 8–23)
CO2: 27 mmol/L (ref 22–32)
Calcium: 8.4 mg/dL — ABNORMAL LOW (ref 8.9–10.3)
Chloride: 101 mmol/L (ref 98–111)
Creatinine, Ser: 0.84 mg/dL (ref 0.44–1.00)
GFR, Estimated: >60 mL/min (ref >60)
Glucose, Bld: 103 mg/dL — ABNORMAL HIGH (ref 70–99)
Potassium: 3.6 mmol/L (ref 3.5–5.1)
Sodium: 137 mmol/L (ref 135–145)
Total Bilirubin: 0.6 mg/dL (ref 0.3–1.2)
Total Protein: 7.4 g/dL (ref 6.5–8.1)

## 2022-07-20 LAB — URINALYSIS, ROUTINE W REFLEX MICROSCOPIC
Bilirubin Urine: NEGATIVE
Glucose, UA: NEGATIVE mg/dL
Hgb urine dipstick: NEGATIVE
Ketones, ur: 5 mg/dL — AB
Nitrite: NEGATIVE
Protein, ur: NEGATIVE mg/dL
Specific Gravity, Urine: 1.013 (ref 1.005–1.030)
pH: 7 (ref 5.0–8.0)

## 2022-07-20 LAB — LIPASE, BLOOD: Lipase: 26 U/L (ref 11–51)

## 2022-07-20 MED ORDER — ESCITALOPRAM OXALATE 10 MG PO TABS
10.0000 mg | ORAL_TABLET | Freq: Every day | ORAL | Status: DC
  Administered 2022-07-22 – 2022-07-23 (×2): 10 mg via ORAL
  Filled 2022-07-20 (×3): qty 1

## 2022-07-20 MED ORDER — DEXTROSE IN LACTATED RINGERS 5 % IV SOLN: INTRAVENOUS | Status: DC

## 2022-07-20 MED ORDER — SODIUM CHLORIDE 0.9 % IV SOLN: Freq: Once | INTRAVENOUS | Status: AC

## 2022-07-20 MED ORDER — PANTOPRAZOLE SODIUM 40 MG PO TBEC
40.0000 mg | DELAYED_RELEASE_TABLET | Freq: Two times a day (BID) | ORAL | Status: DC
  Administered 2022-07-21 – 2022-07-23 (×5): 40 mg via ORAL
  Filled 2022-07-20 (×6): qty 1

## 2022-07-20 MED ORDER — ALBUTEROL SULFATE HFA 108 (90 BASE) MCG/ACT IN AERS
2.0000 | INHALATION_SPRAY | Freq: Two times a day (BID) | RESPIRATORY_TRACT | Status: DC
  Filled 2022-07-20: qty 6.7

## 2022-07-20 MED ORDER — LAMOTRIGINE 25 MG PO TABS: 150.0000 mg | ORAL_TABLET | Freq: Every day | ORAL | Status: DC

## 2022-07-20 MED ORDER — ONDANSETRON HCL 4 MG/2ML IJ SOLN: 4.0000 mg | Freq: Four times a day (QID) | INTRAMUSCULAR | Status: DC | PRN

## 2022-07-20 MED ORDER — SPIRONOLACTONE 25 MG PO TABS
25.0000 mg | ORAL_TABLET | Freq: Every day | ORAL | Status: DC
  Administered 2022-07-22 – 2022-07-23 (×2): 25 mg via ORAL
  Filled 2022-07-20 (×2): qty 1

## 2022-07-20 MED ORDER — ATORVASTATIN CALCIUM 10 MG PO TABS
10.0000 mg | ORAL_TABLET | Freq: Every day | ORAL | Status: DC
  Administered 2022-07-22 – 2022-07-23 (×2): 10 mg via ORAL
  Filled 2022-07-20 (×3): qty 1

## 2022-07-20 MED ORDER — ONDANSETRON HCL 4 MG/2ML IJ SOLN
4.0000 mg | Freq: Once | INTRAMUSCULAR | Status: AC
  Administered 2022-07-20: 4 mg via INTRAVENOUS
  Filled 2022-07-20: qty 2

## 2022-07-20 MED ORDER — BUSPIRONE HCL 5 MG PO TABS
10.0000 mg | ORAL_TABLET | Freq: Two times a day (BID) | ORAL | Status: DC
  Administered 2022-07-21 – 2022-07-23 (×5): 10 mg via ORAL
  Filled 2022-07-20 (×5): qty 2
  Filled 2022-07-20: qty 1

## 2022-07-20 MED ORDER — ONDANSETRON HCL 4 MG PO TABS: 4.0000 mg | ORAL_TABLET | Freq: Four times a day (QID) | ORAL | Status: DC | PRN

## 2022-07-20 MED ORDER — SODIUM CHLORIDE 0.9 % IV BOLUS
1000.0000 mL | Freq: Once | INTRAVENOUS | Status: AC
  Administered 2022-07-20: 1000 mL via INTRAVENOUS

## 2022-07-20 MED ORDER — ATENOLOL-CHLORTHALIDONE 100-25 MG PO TABS: 0.5000 | ORAL_TABLET | Freq: Every day | ORAL | Status: DC

## 2022-07-20 MED ORDER — DOXEPIN HCL 10 MG PO CAPS
10.0000 mg | ORAL_CAPSULE | Freq: Every day | ORAL | Status: DC
  Administered 2022-07-21 – 2022-07-22 (×2): 10 mg via ORAL
  Filled 2022-07-20 (×3): qty 1

## 2022-07-20 MED ORDER — PRIMIDONE 250 MG PO TABS: 125.0000 mg | ORAL_TABLET | ORAL | Status: DC

## 2022-07-20 MED ORDER — TOPIRAMATE 25 MG PO TABS: 50.0000 mg | ORAL_TABLET | Freq: Every day | ORAL | Status: DC

## 2022-07-20 NOTE — ED Notes (Signed)
Pt. Soiled brief with urine.pt. cleaned up with peri-cleanse on washcloth and new brief applied with 2 assist.

## 2022-07-20 NOTE — H&P (Signed)
History and Physical    Patient: Erica Griffin DGU:[id number] DOB: [birth year] DOA: 07/20/2022 DOS: the patient was seen and examined on 07/20/2022 PCP: [name], MD  Patient coming from: Home  Chief Complaint:  Chief Complaint  Patient presents with   Foreign Body   HPI: Erica Griffin is a 72 y.o. female with medical history significant of intellectual debility, allergic rhinitis, dysphagia, previous history of esophageal stricture with dilatation who came to the ER with dysphagia.  Patient reports sensation of foreign body in her throat.  She is spitting and aspirating.  Has not been able to swallow much.  She apparently had similar episode the last time that she had her esophageal stretching.  No hemoptysis no hematemesis.  No bright red blood per rectum.  GI was consulted and recommended admission for observation and possible endoscopy with esophageal dilatation tomorrow.  Review of Systems: As mentioned in the history of present illness. All other systems reviewed and are negative. Past Medical History:  Diagnosis Date   Allergic rhinitis    Asthma    Cognitive communication deficit    Dysphagia    Esophageal stricture    Past Surgical History:  Procedure Laterality Date   BIOPSY  07/09/2020   Procedure: BIOPSY;  Surgeon: Kathi Der, MD;  Location: [facility];  Service: Gastroenterology;;   CHOLECYSTECTOMY     ESOPHAGEAL DILATION  07/09/2020   Procedure: ESOPHAGEAL DILATION;  Surgeon: Kathi Der, MD;  Location: [facility];  Service: Gastroenterology;;   ESOPHAGOGASTRODUODENOSCOPY (EGD) WITH PROPOFOL N/A 04/25/2020   Procedure: ESOPHAGOGASTRODUODENOSCOPY (EGD) WITH PROPOFOL;  Surgeon: Kathi Der, MD;  Location: [facility];  Service: Gastroenterology;  Laterality: N/A;   ESOPHAGOGASTRODUODENOSCOPY (EGD) WITH PROPOFOL N/A 07/09/2020   Procedure: ESOPHAGOGASTRODUODENOSCOPY (EGD) WITH PROPOFOL;  Surgeon: Kathi Der, MD;  Location: [facility];   Service: Gastroenterology;  Laterality: N/A;   FOREIGN BODY REMOVAL  04/25/2020   Procedure: FOREIGN BODY REMOVAL;  Surgeon: Kathi Der, MD;  Location: [facility];  Service: Gastroenterology;;   Social History:  reports that she has never smoked. She has never used smokeless tobacco. She reports that she does not drink alcohol and does not use drugs.  Allergies  Allergen Reactions   Codeine Nausea And Vomiting    Family History  Problem Relation Age of Onset   Asthma Maternal Aunt     Prior to Admission medications   Medication Sig Start Date End Date Taking? Authorizing Provider  acetaminophen (TYLENOL) 325 MG tablet Take 650 mg by mouth 2 (two) times daily.    [provider]  acetaminophen (TYLENOL) 500 MG tablet Take 1,000 mg by mouth every 8 (eight) hours as needed for mild pain.    [provider]  albuterol (VENTOLIN HFA) 108 (90 Base) MCG/ACT inhaler Inhale 2 puffs into the lungs 2 (two) times daily. 11/19/21   Luciano Cutter, MD  ARTIFICIAL TEAR INSERT OP     [provider]  atenolol-chlorthalidone (TENORETIC) 100-25 MG tablet Take 0.5 tablets by mouth daily. 06/14/20   [provider]  atorvastatin (LIPITOR) 10 MG tablet Take 10 mg by mouth daily. 04/22/22   [provider]  azelastine (ASTELIN) 0.1 % nasal spray Place 1 spray into both nostrils 2 (two) times daily. 03/05/20   [provider]  busPIRone (BUSPAR) 10 MG tablet Take 10 mg by mouth 2 (two) times daily. 04/02/20   [provider]  Cholecalciferol (VITAMIN D-3) 125 MCG (5000 UT) TABS Take 5,000 Units by mouth daily.  [provider]  dextromethorphan (DELSYM) 30 MG/5ML liquid Take 60 mg by mouth every 12 (twelve) hours as needed for cough.    [provider]  doxepin (SINEQUAN) 10 MG capsule Take 10 mg by mouth at bedtime. 05/01/22   [provider]  escitalopram (LEXAPRO) 10 MG tablet Take by mouth. 12/18/18   [provider]  fluticasone (FLONASE) 50 MCG/ACT nasal spray Place 1 spray into both nostrils daily. 11/06/13   [provider]  guaiFENesin (MUCINEX) 600 MG 12 hr tablet Take 600 mg by mouth 2 (two) times daily.    [provider]  lamoTRIgine (LAMICTAL) 150 MG tablet Take 150 mg by mouth daily. 04/19/20   [provider]  lidocaine (LIDODERM) 5 % Place 1 patch onto the skin daily. Remove & Discard patch within 12 hours or as directed by MD  TO BILATERAL THIGH    [provider]  LUMIGAN 0.01 % SOLN Place 1 drop into both eyes daily. 04/04/20   [provider]  montelukast (SINGULAIR) 10 MG tablet Take 10 mg by mouth daily. 04/04/20   [provider]  Multiple Vitamin (MULTIVITAMIN WITH MINERALS) TABS tablet Take 1 tablet by mouth daily.    [provider]  nystatin ointment (MYCOSTATIN) SMARTSIG:1 Topical Daily 04/24/22   [provider]  oxybutynin (DITROPAN-XL) 5 MG 24 hr tablet Take 5 mg by mouth daily. 04/16/20   [provider]  pantoprazole (PROTONIX) 40 MG tablet Take 1 tablet (40 mg total) by mouth 2 (two) times daily. 05/22/22   Luciano Cutter, MD  polyvinyl alcohol (LIQUIFILM TEARS) 1.4 % ophthalmic solution Place 1 drop into both eyes 4 (four) times daily.    [provider]  primidone (MYSOLINE) 250 MG tablet Take 125-250 mg by mouth See admin instructions. 125mg  in the morning 250mg  at bedtime 04/08/20   [provider]  PSYLLIUM FIBER PO Take 0.4 g by mouth 2 (two) times daily.    [provider]  spironolactone (ALDACTONE) 25 MG tablet Take 25 mg by mouth daily. 05/21/22   [provider]  topiramate (TOPAMAX) 50 MG tablet Take 50 mg by mouth daily. 04/02/20   [provider]    Physical Exam: Vitals:   07/20/22 2052 07/20/22 2053 07/20/22 2055 07/20/22 2205  BP:    137/64  Pulse: 83 81 81 79  Resp: (!) 23 (!) 23 (!) 22 16  Temp:      TempSrc:      SpO2: (!)  86% (!) 87% 95% 97%   Constitutional: Chronically ill looking, NAD, calm, comfortable Eyes: PERRL, lids and conjunctivae normal ENMT: Mucous membranes are moist. Posterior pharynx clear of any exudate or lesions.Normal dentition.  Neck: normal, supple, no masses, no thyromegaly Respiratory: Coarse breath sounds bilaterally bilaterally, no wheezing, no crackles. Normal respiratory effort. No accessory muscle use.  Cardiovascular: Regular rate and rhythm, no murmurs / rubs / gallops. No extremity edema. 2+ pedal pulses. No carotid bruits.  Abdomen: no tenderness, no masses palpated. No hepatosplenomegaly. Bowel sounds positive.  Musculoskeletal: Good range of motion, no joint swelling or tenderness, Skin: no rashes, lesions, ulcers. No induration Neurologic: CN 2-12 grossly intact. Sensation intact, DTR normal. Strength 5/5 in all 4.  Psychiatric: Intellectually challenged  Data Reviewed:  Urinalysis negative.  Chest x-ray showed no foreign body.  CBC within normal.  Calcium 8.4 and glucose 103.  Assessment and Plan:  #1 dysphagia: Patient will be admitted.  Will place on observation.  GI already consulted.  Keep n.p.o. after midnight.  More than likely will get EGD with esophageal stretching tomorrow if indicated.  In the meantime IV fluids.  Nausea management.  Pain control.  Continue Protonix.  #2 GERD: Continue PPIs.  #3 seizure disorder: Will continue home regimen as tolerated.  #4 hypoxemia: Oxygen saturation noted to be in the 80s on arrival.  Worrisome for possible aspiration but not seen on chest x-ray.  Continue to monitor oxygenation.  #5 intellectual disability: Patient able to communicate.  Also has some insight into disease.    Advance Care Planning:   Code Status: Full Code   Consults: Gastroenterology Dr. Ewing Schlein  Family Communication: No family at bedside  Severity of Illness: The appropriate patient status for this patient is OBSERVATION. Observation status is  judged to be reasonable and necessary in order to provide the required intensity of service to ensure the patient's safety. The patient's presenting symptoms, physical exam findings, and initial radiographic and laboratory data in the context of their medical condition is felt to place them at decreased risk for further clinical deterioration. Furthermore, it is anticipated that the patient will be medically stable for discharge from the hospital within 2 midnights of admission.   AuthorLonia Blood, MD 07/20/2022 11:24 PM  For on call review [email or web address].

## 2022-07-20 NOTE — ED Triage Notes (Signed)
BIBA from [facility] for a pill that has been stuck in her throat for 2 days. Pt is spitting up upon arrival, no difficulties breathing. 112 BP palp 79 HR 93% room air

## 2022-07-20 NOTE — ED Provider Notes (Signed)
[facility]Y DEPART[facility] AT [facility] Provider Note   CSN: [id number] Arrival date & time: 07/20/22  4098     History  Chief Complaint  Patient presents with   Foreign Body    Erica Griffin is a 72 y.o. female.  72 year old female with prior medical history as detailed below presents from [facility].  Patient reports persistent nausea and spitting x 2 days. Not taking PO well per staff.  Patient with history of pill esophagitis - known to [facility].  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  Patient denies shortness of breath.  She denies fever.  She continues to spit and retch during evaluation.  The history is provided by the patient and medical records.       Home Medications Prior to Admission medications   Medication Sig Start Date End Date Taking? Authorizing Provider  acetaminophen (TYLENOL) 325 MG tablet Take 650 mg by mouth 2 (two) times daily.    [provider]  acetaminophen (TYLENOL) 500 MG tablet Take 1,000 mg by mouth every 8 (eight) hours as needed for mild pain.    [provider]  albuterol (VENTOLIN HFA) 108 (90 Base) MCG/ACT inhaler Inhale 2 puffs into the lungs 2 (two) times daily. 11/19/21   Luciano Cutter, MD  ARTIFICIAL TEAR INSERT OP     [provider]  atenolol-chlorthalidone (TENORETIC) 100-25 MG tablet Take 0.5 tablets by mouth daily. 06/14/20   [provider]  atorvastatin (LIPITOR) 10 MG tablet Take 10 mg by mouth daily. 04/22/22   [provider]  azelastine (ASTELIN) 0.1 % nasal spray Place 1 spray into both nostrils 2 (two) times daily. 03/05/20   [provider]  busPIRone (BUSPAR) 10 MG tablet Take 10 mg by mouth 2 (two) times daily. 04/02/20   [provider]  Cholecalciferol (VITAMIN D-3) 125 MCG (5000 UT) TABS Take 5,000 Units by mouth daily.    [provider]  dextromethorphan (DELSYM) 30 MG/5ML liquid Take 60 mg by  mouth every 12 (twelve) hours as needed for cough.    [provider]  doxepin (SINEQUAN) 10 MG capsule Take 10 mg by mouth at bedtime. 05/01/22   [provider]  escitalopram (LEXAPRO) 10 MG tablet Take by mouth. 12/18/18   [provider]  fluticasone (FLONASE) 50 MCG/ACT nasal spray Place 1 spray into both nostrils daily. 11/06/13   [provider]  guaiFENesin (MUCINEX) 600 MG 12 hr tablet Take 600 mg by mouth 2 (two) times daily.    [provider]  lamoTRIgine (LAMICTAL) 150 MG tablet Take 150 mg by mouth daily. 04/19/20   [provider]  lidocaine (LIDODERM) 5 % Place 1 patch onto the skin daily. Remove & Discard patch within 12 hours or as directed by MD  TO BILATERAL THIGH    [provider]  LUMIGAN 0.01 % SOLN Place 1 drop into both eyes daily. 04/04/20   [provider]  montelukast (SINGULAIR) 10 MG tablet Take 10 mg by mouth daily. 04/04/20   [provider]  Multiple Vitamin (MULTIVITAMIN WITH MINERALS) TABS tablet Take 1 tablet by mouth daily.    [provider]  nystatin ointment (MYCOSTATIN) SMARTSIG:1 Topical Daily 04/24/22   [provider]  oxybutynin (DITROPAN-XL) 5 MG 24 hr tablet Take 5 mg by mouth daily. 04/16/20   [provider]  pantoprazole (PROTONIX) 40 MG tablet Take 1 tablet (40 mg total) by mouth 2 (two) times  daily. 05/22/22   Luciano Cutter, MD  polyvinyl alcohol (LIQUIFILM TEARS) 1.4 % ophthalmic solution Place 1 drop into both eyes 4 (four) times daily.    [provider]  primidone (MYSOLINE) 250 MG tablet Take 125-250 mg by mouth See admin instructions. 125mg  in the morning 250mg  at bedtime 04/08/20   [provider]  PSYLLIUM FIBER PO Take 0.4 g by mouth 2 (two) times daily.    [provider]  spironolactone (ALDACTONE) 25 MG tablet Take 25 mg by mouth daily. 05/21/22   [provider]  topiramate (TOPAMAX) 50 MG tablet Take  50 mg by mouth daily. 04/02/20   [provider]      Allergies    Codeine    Review of Systems   Review of Systems  All other systems reviewed and are negative.   Physical Exam Updated Vital Signs BP (!) 146/63 (BP Location: Right Arm)   Pulse 77 Comment: Simultaneous filing. User may not have seen previous data.  Temp 98.2 F (36.8 C) (Oral)   Resp 18   SpO2 92% Comment: Simultaneous filing. User may not have seen previous data. Physical Exam Vitals and nursing note reviewed.  Constitutional:      General: She is not in acute distress.    Appearance: Normal appearance. She is well-developed.  HENT:     Head: Normocephalic and atraumatic.  Eyes:     Conjunctiva/sclera: Conjunctivae normal.     Pupils: Pupils are equal, round, and reactive to light.  Cardiovascular:     Rate and Rhythm: Normal rate and regular rhythm.     Heart sounds: Normal heart sounds.  Pulmonary:     Effort: Pulmonary effort is normal. No respiratory distress.     Breath sounds: Normal breath sounds.  Abdominal:     General: There is no distension.     Palpations: Abdomen is soft.     Tenderness: There is no abdominal tenderness.  Musculoskeletal:        General: No deformity. Normal range of motion.     Cervical back: Normal range of motion and neck supple.  Skin:    General: Skin is warm and dry.  Neurological:     General: No focal deficit present.     Mental Status: She is alert and oriented to person, place, and time.     ED Results / Procedures / Treatments   Labs (all labs ordered are listed, but only abnormal results are displayed) Labs Reviewed - No data to display  EKG None  Radiology No results found.  Procedures Procedures    Medications Ordered in ED Medications - No data to display  ED Course/ Medical Decision Making/ A&P                             Medical Decision Making Amount and/or Complexity of Data Reviewed Labs: ordered. Radiology:  ordered.  Risk Prescription drug management.    Medical Screen Complete  This patient presented to the ED with complaint of dysphagia.  This complaint involves an extensive number of treatment options. The initial differential diagnosis includes, but is not limited to, esophageal stricture, pill esophagitis, metabolic abnormality, etc.  This presentation is: Acute, Chronic, Self-Limited, Previously Undiagnosed, Uncertain Prognosis, Complicated, Systemic Symptoms, and Threat to Life/Bodily Function  Patient with known history of intellectual disability, esophageal stricture, esophagitis presents with increased dysphagia x 2 to 3 days.  Patient is spitting her own  secretions continuously during exam.  Patient known to [facility] ([facility]).  Last EGD in 2022 demonstrated nonobstructing Schatzki ring which was dilated.  Case discussed with Dr. Ewing Schlein [facility]) who agrees with plan to admit to hospitalist service for overnight observation - will plan for likely EGD tomorrow.   Hospitalist service made aware of case.     Additional history obtained:  External records from outside sources obtained and reviewed including prior ED visits and prior Inpatient records.    Lab Tests:  I ordered and personally interpreted labs.  The pertinent results include: BC, CMP, UA, lipase   Imaging Studies ordered:  I ordered imaging studies including chest x-ray I independently visualized and interpreted obtained imaging which showed NAD I agree with the radiologist interpretation.   Cardiac Monitoring:  The patient was maintained on a cardiac monitor.  I personally viewed and interpreted the cardiac monitor which showed an underlying rhythm of: NSR   Medicines ordered:  I ordered medication including IV fluids, Zofran for nausea, dehydration Reevaluation of the patient after these medicines showed that the patient: improved   Problem List / ED  Course:  Dysphagia   Reevaluation:  After the interventions noted above, I reevaluated the patient and found that they have: stayed the same   Disposition:  After consideration of the diagnostic results and the patients response to treatment, I feel that the patent would benefit from admission.          Final Clinical Impression(s) / ED Diagnoses Final diagnoses:  Dysphagia, unspecified type    Rx / DC Orders ED Discharge Orders     None         Wynetta Fines, MD 07/20/22 2243

## 2022-07-20 NOTE — ED Notes (Signed)
US PIV placed. 

## 2022-07-20 NOTE — ED Notes (Signed)
Will perform US PIV 

## 2022-07-21 ENCOUNTER — Observation Stay (HOSPITAL_COMMUNITY): Payer: Medicare Other | Admitting: Anesthesiology

## 2022-07-21 ENCOUNTER — Encounter (HOSPITAL_COMMUNITY)
Admission: EM | Disposition: A | Discharge status: Skilled Nursing Facility | Payer: Self-pay | Source: Home / Self Care | Attending: Emergency Medicine

## 2022-07-21 ENCOUNTER — Observation Stay (HOSPITAL_BASED_OUTPATIENT_CLINIC_OR_DEPARTMENT_OTHER): Payer: Medicare Other | Admitting: Anesthesiology

## 2022-07-21 ENCOUNTER — Encounter (HOSPITAL_COMMUNITY): Payer: Self-pay | Admitting: Internal Medicine

## 2022-07-21 ENCOUNTER — Observation Stay (HOSPITAL_COMMUNITY): Payer: Medicare Other

## 2022-07-21 DIAGNOSIS — I10 Essential (primary) hypertension: Secondary | ICD-10-CM

## 2022-07-21 DIAGNOSIS — K2210 Ulcer of esophagus without bleeding: Secondary | ICD-10-CM

## 2022-07-21 DIAGNOSIS — K449 Diaphragmatic hernia without obstruction or gangrene: Secondary | ICD-10-CM

## 2022-07-21 DIAGNOSIS — J45909 Unspecified asthma, uncomplicated: Secondary | ICD-10-CM

## 2022-07-21 DIAGNOSIS — R1310 Dysphagia, unspecified: Secondary | ICD-10-CM | POA: Diagnosis not present

## 2022-07-21 DIAGNOSIS — T18108A Unspecified foreign body in esophagus causing other injury, initial encounter: Secondary | ICD-10-CM

## 2022-07-21 DIAGNOSIS — K222 Esophageal obstruction: Secondary | ICD-10-CM

## 2022-07-21 HISTORY — PX: ESOPHAGOGASTRODUODENOSCOPY (EGD) WITH PROPOFOL: SHX5813

## 2022-07-21 LAB — CBC
HCT: 38.9 % (ref 36.0–46.0)
Hemoglobin: 12.2 g/dL (ref 12.0–15.0)
MCH: 31.4 pg (ref 26.0–34.0)
MCHC: 31.4 g/dL (ref 30.0–36.0)
MCV: 100.3 fL — ABNORMAL HIGH (ref 80.0–100.0)
Platelets: 247 10*3/uL (ref 150–400)
RBC: 3.88 MIL/uL (ref 3.87–5.11)
RDW: 14.7 % (ref 11.5–15.5)
WBC: 7 10*3/uL (ref 4.0–10.5)
nRBC: 0 % (ref 0.0–0.2)

## 2022-07-21 LAB — COMPREHENSIVE METABOLIC PANEL
ALT: 16 U/L (ref 0–44)
AST: 19 U/L (ref 15–41)
Albumin: 3.4 g/dL — ABNORMAL LOW (ref 3.5–5.0)
Alkaline Phosphatase: 104 U/L (ref 38–126)
Anion gap: 10 (ref 5–15)
BUN: 9 mg/dL (ref 8–23)
CO2: 24 mmol/L (ref 22–32)
Calcium: 8.1 mg/dL — ABNORMAL LOW (ref 8.9–10.3)
Chloride: 99 mmol/L (ref 98–111)
Creatinine, Ser: 0.74 mg/dL (ref 0.44–1.00)
GFR, Estimated: >60 mL/min (ref >60)
Glucose, Bld: 101 mg/dL — ABNORMAL HIGH (ref 70–99)
Potassium: 3.4 mmol/L — ABNORMAL LOW (ref 3.5–5.1)
Sodium: 133 mmol/L — ABNORMAL LOW (ref 135–145)
Total Bilirubin: 0.6 mg/dL (ref 0.3–1.2)
Total Protein: 7 g/dL (ref 6.5–8.1)

## 2022-07-21 LAB — HIV ANTIBODY (ROUTINE TESTING W REFLEX): HIV Screen 4th Generation wRfx: NONREACTIVE

## 2022-07-21 SURGERY — ESOPHAGOGASTRODUODENOSCOPY (EGD) WITH PROPOFOL
Anesthesia: General

## 2022-07-21 MED ORDER — PRIMIDONE 250 MG PO TABS
250.0000 mg | ORAL_TABLET | Freq: Every day | ORAL | Status: DC
  Administered 2022-07-21 – 2022-07-22 (×2): 250 mg via ORAL
  Filled 2022-07-21 (×3): qty 1

## 2022-07-21 MED ORDER — ATENOLOL 50 MG PO TABS
50.0000 mg | ORAL_TABLET | Freq: Every day | ORAL | Status: DC
  Administered 2022-07-21 – 2022-07-23 (×3): 50 mg via ORAL
  Filled 2022-07-21 (×3): qty 1

## 2022-07-21 MED ORDER — LOPERAMIDE HCL 2 MG PO CAPS
2.0000 mg | ORAL_CAPSULE | Freq: Once | ORAL | Status: AC
  Administered 2022-07-22: 2 mg via ORAL
  Filled 2022-07-21: qty 1

## 2022-07-21 MED ORDER — LIDOCAINE 2% (20 MG/ML) 5 ML SYRINGE
INTRAMUSCULAR | Status: DC | PRN
  Administered 2022-07-21: 100 mg via INTRAVENOUS

## 2022-07-21 MED ORDER — HYDROCORTISONE 1 % EX CREA
1.0000 | TOPICAL_CREAM | Freq: Three times a day (TID) | CUTANEOUS | Status: DC | PRN
  Administered 2022-07-22 (×2): 1 via TOPICAL
  Filled 2022-07-21: qty 28

## 2022-07-21 MED ORDER — ONDANSETRON HCL 4 MG/2ML IJ SOLN
INTRAMUSCULAR | Status: DC | PRN
  Administered 2022-07-21: 4 mg via INTRAVENOUS

## 2022-07-21 MED ORDER — POTASSIUM CHLORIDE 10 MEQ/100ML IV SOLN
10.0000 meq | INTRAVENOUS | Status: AC
  Administered 2022-07-21: 10 meq via INTRAVENOUS
  Filled 2022-07-21: qty 100

## 2022-07-21 MED ORDER — SUCCINYLCHOLINE CHLORIDE 200 MG/10ML IV SOSY
PREFILLED_SYRINGE | INTRAVENOUS | Status: DC | PRN
  Administered 2022-07-21: 120 mg via INTRAVENOUS

## 2022-07-21 MED ORDER — LACTATED RINGERS IV SOLN
INTRAVENOUS | Status: DC
  Administered 2022-07-21: 1000 mL via INTRAVENOUS

## 2022-07-21 MED ORDER — DEXAMETHASONE SODIUM PHOSPHATE 10 MG/ML IJ SOLN
INTRAMUSCULAR | Status: DC | PRN
  Administered 2022-07-21: 8 mg via INTRAVENOUS

## 2022-07-21 MED ORDER — PRIMIDONE 250 MG PO TABS
125.0000 mg | ORAL_TABLET | Freq: Every day | ORAL | Status: DC
  Administered 2022-07-22 – 2022-07-23 (×2): 125 mg via ORAL
  Filled 2022-07-21 (×3): qty 1

## 2022-07-21 MED ORDER — AEROCHAMBER Z-STAT PLUS/MEDIUM MISC
Status: AC
  Filled 2022-07-21: qty 1

## 2022-07-21 MED ORDER — SODIUM CHLORIDE 0.9 % IV SOLN: INTRAVENOUS | Status: DC

## 2022-07-21 MED ORDER — LAMOTRIGINE 100 MG PO TABS
100.0000 mg | ORAL_TABLET | Freq: Two times a day (BID) | ORAL | Status: DC
  Administered 2022-07-21 – 2022-07-23 (×5): 100 mg via ORAL
  Filled 2022-07-21 (×5): qty 1

## 2022-07-21 MED ORDER — CHLORTHALIDONE 25 MG PO TABS
12.5000 mg | ORAL_TABLET | Freq: Every day | ORAL | Status: DC
  Administered 2022-07-21 – 2022-07-23 (×3): 12.5 mg via ORAL
  Filled 2022-07-21 (×3): qty 0.5

## 2022-07-21 MED ORDER — PROPOFOL 10 MG/ML IV BOLUS
INTRAVENOUS | Status: DC | PRN
  Administered 2022-07-21: 170 mg via INTRAVENOUS

## 2022-07-21 MED ORDER — SODIUM CHLORIDE 0.9 % IV SOLN
3.0000 g | Freq: Four times a day (QID) | INTRAVENOUS | Status: DC
  Administered 2022-07-21 – 2022-07-23 (×9): 3 g via INTRAVENOUS
  Filled 2022-07-21 (×11): qty 8

## 2022-07-21 MED ORDER — SUCRALFATE 1 GM/10ML PO SUSP
1.0000 g | Freq: Two times a day (BID) | ORAL | Status: DC
  Administered 2022-07-21 – 2022-07-23 (×5): 1 g via ORAL
  Filled 2022-07-21 (×5): qty 10

## 2022-07-21 MED ORDER — ALBUTEROL SULFATE (2.5 MG/3ML) 0.083% IN NEBU
2.5000 mg | INHALATION_SOLUTION | Freq: Two times a day (BID) | RESPIRATORY_TRACT | Status: DC
  Administered 2022-07-21 – 2022-07-22 (×2): 2.5 mg via RESPIRATORY_TRACT
  Filled 2022-07-21 (×3): qty 3

## 2022-07-21 SURGICAL SUPPLY — 15 items

## 2022-07-21 NOTE — Progress Notes (Signed)
Pharmacy Antibiotic Note  Erica Griffin is a 72 y.o. female admitted on 07/20/2022 with  aspiration PNA .  Pharmacy has been consulted for Unasyn dosing.  ID: Aspiration PNA - Afebrile, WBC 7, Scr <1  5/7 Unasyn>>  Plan: Unasyn 3g IV q6hr Pharmacy will sign off. Please reconsult for further dosing assitance.     Height: 5\' 6"  (167.6 cm) Weight: 133 kg (293 lb 3.4 oz) IBW/kg (Calculated) : 59.3  Temp (24hrs), Avg:98 F (36.7 C), Min:97.4 F (36.3 C), Max:98.5 F (36.9 C)  Recent Labs  Lab 07/20/22 1933 07/20/22 2046 07/21/22 0616  WBC 8.0  --  7.0  CREATININE  --  0.84 0.74    Estimated Creatinine Clearance: 91.7 mL/min (by C-G formula based on SCr of 0.74 mg/dL).    Allergies  Allergen Reactions   Codeine Nausea And Vomiting    Jesson Foskey S. Merilynn Finland, PharmD, BCPS Clinical Staff Pharmacist [email or web address]  Pasty Spillers 07/21/2022 10:17 AM

## 2022-07-21 NOTE — Consult Note (Signed)
Referring Provider: EDP Primary Care Physician:  [name], MD Primary Gastroenterologist: Gentry Fitz Reason for Consultation: Possible food impaction  HPI: Erica Griffin is a 72 y.o. female brought to the hospital  from [facility] for possible food impaction. Patient with history of cognitive deficit, generalized muscle weakness and GERD .  Complaining of sensation of something stuck in her throat since Sunday.  Able to keep down some liquid but most of the food comes back.  Denies any blood in the stool or black stool.   Similar presentation in 2022 and EGD at that time showed retained pill and small amount of retained food at GE junction.  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  Biopsies were negative for H. pylori.  Esophageal biopsy showed increased eosinophils at 20 per high-power field.  Past Medical History:  Diagnosis Date   Allergic rhinitis    Asthma    Cognitive communication deficit    Dysphagia    Esophageal stricture     Past Surgical History:  Procedure Laterality Date   BIOPSY  07/09/2020   Procedure: BIOPSY;  Surgeon: Kathi Der, MD;  Location: [facility];  Service: Gastroenterology;;   CHOLECYSTECTOMY     ESOPHAGEAL DILATION  07/09/2020   Procedure: ESOPHAGEAL DILATION;  Surgeon: Kathi Der, MD;  Location: [facility];  Service: Gastroenterology;;   ESOPHAGOGASTRODUODENOSCOPY (EGD) WITH PROPOFOL N/A 04/25/2020   Procedure: ESOPHAGOGASTRODUODENOSCOPY (EGD) WITH PROPOFOL;  Surgeon: Kathi Der, MD;  Location: [facility];  Service: Gastroenterology;  Laterality: N/A;   ESOPHAGOGASTRODUODENOSCOPY (EGD) WITH PROPOFOL N/A 07/09/2020   Procedure: ESOPHAGOGASTRODUODENOSCOPY (EGD) WITH PROPOFOL;  Surgeon: Kathi Der, MD;  Location: [facility];  Service: Gastroenterology;  Laterality: N/A;   FOREIGN BODY REMOVAL  04/25/2020   Procedure: FOREIGN BODY REMOVAL;  Surgeon: Kathi Der,  MD;  Location: [facility];  Service: Gastroenterology;;    Prior to Admission medications   Medication Sig Start Date End Date Taking? Authorizing Provider  acetaminophen (TYLENOL) 325 MG tablet Take 650 mg by mouth 2 (two) times daily.    [provider]  acetaminophen (TYLENOL) 500 MG tablet Take 1,000 mg by mouth every 8 (eight) hours as needed for mild pain.    [provider]  albuterol (VENTOLIN HFA) 108 (90 Base) MCG/ACT inhaler Inhale 2 puffs into the lungs 2 (two) times daily. 11/19/21   Luciano Cutter, MD  ARTIFICIAL TEAR INSERT OP     [provider]  atenolol-chlorthalidone (TENORETIC) 100-25 MG tablet Take 0.5 tablets by mouth daily. 06/14/20   [provider]  atorvastatin (LIPITOR) 10 MG tablet Take 10 mg by mouth daily. 04/22/22   [provider]  azelastine (ASTELIN) 0.1 % nasal spray Place 1 spray into both nostrils 2 (two) times daily. 03/05/20   [provider]  busPIRone (BUSPAR) 10 MG tablet Take 10 mg by mouth 2 (two) times daily. 04/02/20   [provider]  Cholecalciferol (VITAMIN D-3) 125 MCG (5000 UT) TABS Take 5,000 Units by mouth daily.    [provider]  dextromethorphan (DELSYM) 30 MG/5ML liquid Take 60 mg by mouth every 12 (twelve) hours as needed for cough.    [provider]  doxepin (SINEQUAN) 10 MG capsule Take 10 mg by mouth at bedtime. 05/01/22   [provider]  escitalopram (LEXAPRO) 10 MG tablet Take by mouth. 12/18/18   [provider]  fluticasone (FLONASE) 50 MCG/ACT nasal spray Place 1 spray into both nostrils daily. 11/06/13  [provider]  guaiFENesin (MUCINEX) 600 MG 12 hr tablet Take 600 mg by mouth 2 (two) times daily.    [provider]  lamoTRIgine (LAMICTAL) 150 MG tablet Take 150 mg by mouth daily. 04/19/20   [provider]  lidocaine (LIDODERM) 5 % Place 1 patch onto the skin daily. Remove & Discard patch within 12 hours  or as directed by MD  TO BILATERAL THIGH    [provider]  LUMIGAN 0.01 % SOLN Place 1 drop into both eyes daily. 04/04/20   [provider]  montelukast (SINGULAIR) 10 MG tablet Take 10 mg by mouth daily. 04/04/20   [provider]  Multiple Vitamin (MULTIVITAMIN WITH MINERALS) TABS tablet Take 1 tablet by mouth daily.    [provider]  nystatin ointment (MYCOSTATIN) SMARTSIG:1 Topical Daily 04/24/22   [provider]  oxybutynin (DITROPAN-XL) 5 MG 24 hr tablet Take 5 mg by mouth daily. 04/16/20   [provider]  pantoprazole (PROTONIX) 40 MG tablet Take 1 tablet (40 mg total) by mouth 2 (two) times daily. 05/22/22   Luciano Cutter, MD  polyvinyl alcohol (LIQUIFILM TEARS) 1.4 % ophthalmic solution Place 1 drop into both eyes 4 (four) times daily.    [provider]  primidone (MYSOLINE) 250 MG tablet Take 125-250 mg by mouth See admin instructions. 125mg  in the morning 250mg  at bedtime 04/08/20   [provider]  PSYLLIUM FIBER PO Take 0.4 g by mouth 2 (two) times daily.    [provider]  spironolactone (ALDACTONE) 25 MG tablet Take 25 mg by mouth daily. 05/21/22   [provider]  topiramate (TOPAMAX) 50 MG tablet Take 50 mg by mouth daily. 04/02/20   [provider]    Scheduled Meds:  aerochamber Z-Stat Plus/medium       albuterol  2.5 mg Nebulization BID   atenolol-chlorthalidone  0.5 tablet Oral Daily   atorvastatin  10 mg Oral Daily   busPIRone  10 mg Oral BID   doxepin  10 mg Oral QHS   escitalopram  10 mg Oral Daily   lamoTRIgine  150 mg Oral Daily   pantoprazole  40 mg Oral BID   primidone  250 mg Oral QHS   And   primidone  125 mg Oral Daily   spironolactone  25 mg Oral Daily   Continuous Infusions:  ampicillin-sulbactam (UNASYN) IV     dextrose 5% lactated ringers 100 mL/hr at 07/21/22 0428   potassium chloride     PRN Meds:.aerochamber Z-Stat Plus/medium, ondansetron  **OR** ondansetron (ZOFRAN) IV  Allergies as of 07/20/2022 - Review Complete 07/20/2022  Allergen Reaction Noted   Codeine Nausea And Vomiting 02/06/2014    Family History  Problem Relation Age of Onset   Asthma Maternal Aunt     Social History   Socioeconomic History   Marital status: Divorced    Spouse name: Not on file   Number of children: Not on file   Years of education: Not on file   Highest education level: Not on file  Occupational History   Not on file  Tobacco Use   Smoking status: Never   Smokeless tobacco: Never  Vaping Use   Vaping Use: Never used  Substance and Sexual Activity   Alcohol use: Never   Drug use: Never   Sexual activity: Not Currently  Other Topics Concern   Not on file  Social History Narrative   Not on file   Social Determinants of  Health   Financial Resource Strain: Not on file  Food Insecurity: Not on file  Transportation Needs: Not on file  Physical Activity: Not on file  Stress: Not on file  Social Connections: Not on file  Intimate Partner Violence: Not on file    Review of Systems: All negative except as stated above in HPI.  Physical Exam: Vital signs: Vitals:   07/21/22 0522 07/21/22 0901  BP: (!) 151/79 137/72  Pulse: 78 76  Resp: 14   Temp: 98 F (36.7 C) 98.5 F (36.9 C)  SpO2: 97% 100%   Last BM Date : 07/21/22 (per patient) General: Resting comfortably, not in acute distress Lungs: No visible respiratory distress Heart:  Regular rate and rhythm; no murmurs, clicks, rubs,  or gallops. Abdomen: Soft, nontender, nondistended, bowel sounds present, no peritoneal signs Rectal:  Deferred  GI:  Lab Results: Recent Labs    07/20/22 1933 07/21/22 0616  WBC 8.0 7.0  HGB 13.1 12.2  HCT 40.4 38.9  PLT 289 247   BMET Recent Labs    07/20/22 2046 07/21/22 0616  NA 137 133*  K 3.6 3.4*  CL 101 99  CO2 27 24  GLUCOSE 103* 101*  BUN 11 9  CREATININE 0.84 0.74  CALCIUM 8.4* 8.1*   LFT Recent Labs     07/21/22 0616  PROT 7.0  ALBUMIN 3.4*  AST 19  ALT 16  ALKPHOS 104  BILITOT 0.6   PT/INR No results for input(s): "LABPROT", "INR" in the last 72 hours.   Studies/Results: DG Chest Port 1 View  Result Date: 07/20/2022 CLINICAL DATA:  Two day history of pill stuck in the throat EXAM: PORTABLE CHEST 1 VIEW COMPARISON:  Chest radiograph dated 06/20/2020 FINDINGS: Low lung volumes. Bibasilar patchy opacities. No pleural effusion or pneumothorax. The heart size and mediastinal contours are within normal limits. No acute osseous abnormality. No radiopaque foreign body. IMPRESSION: 1. No radiopaque foreign body. 2. Low lung volumes with bibasilar patchy opacities, likely atelectasis. Electronically Signed   By: Agustin Cree M.D.   On: 07/20/2022 19:23    Impression/Plan: -Dysphagia with possible food impaction -History of pill induced esophagitis/esophageal ulcer -Cognitive impairment  Recommendations -------------------------- -Proceed with EGD with possible removal of food bolus.  Risks (bleeding, infection, bowel perforation that could require surgery, sedation-related changes in cardiopulmonary systems), benefits (identification and possible treatment of source of symptoms, exclusion of certain causes of symptoms), and alternatives (watchful waiting, radiographic imaging studies, empiric medical treatment)  were explained to patient/family in detail and patient wishes to proceed.     LOS: 0 days   Kathi Der  MD, FACP 07/21/2022, 9:11 AM  Contact #  [phone]

## 2022-07-21 NOTE — Anesthesia Preprocedure Evaluation (Signed)
Anesthesia Evaluation    Reviewed: Allergy & Precautions, Patient's Chart, lab work & pertinent test results  Airway Mallampati: II  TM Distance: >3 FB Neck ROM: Full    Dental no notable dental hx. (+) Edentulous Upper, Edentulous Lower   Pulmonary asthma , COPD,  COPD inhaler   Pulmonary exam normal breath sounds clear to auscultation       Cardiovascular hypertension, Pt. on medications and Pt. on home beta blockers Normal cardiovascular exam Rhythm:Regular Rate:Normal     Neuro/Psych Cognitive disability- lives in a group home negative neurological ROS  negative psych ROS   GI/Hepatic Neg liver ROS,GERD  Medicated and Controlled,,Esophageal stricture Hx of Dysphasia   Endo/Other  Obesity  Renal/GU negative Renal ROSLab Results      Component                Value               Date                      CREATININE               0.74                07/21/2022                 K                        3.4 (L)             07/21/2022                Bladder dysfunction      Musculoskeletal negative musculoskeletal ROS (+)    Abdominal  (+) + obese  Peds  Hematology negative hematology ROS (+) Lab Results      Component                Value               Date                      WBC                      7.0                 07/21/2022                HGB                      12.2                07/21/2022                HCT                      38.9                07/21/2022                MCV                      100.3 (H)           07/21/2022                PLT  247                 07/21/2022              Anesthesia Other Findings   Reproductive/Obstetrics                                                              Anesthesia Evaluation  Patient identified by MRN, date of birth, ID band Patient awake    Reviewed: Allergy & Precautions, NPO status , Patient's  Chart, lab work & pertinent test results  History of Anesthesia Complications Negative for: history of anesthetic complications  Airway Mallampati: II  TM Distance: >3 FB Neck ROM: Full    Dental  (+) Edentulous Upper, Missing   Pulmonary neg pulmonary ROS,    Pulmonary exam normal        Cardiovascular negative cardio ROS Normal cardiovascular exam     Neuro/Psych Seizures -,  Cognitive disability- lives in group home negative psych ROS   GI/Hepatic negative GI ROS, Neg liver ROS,   Endo/Other  Morbid obesity  Renal/GU negative Renal ROS  negative genitourinary   Musculoskeletal negative musculoskeletal ROS (+)   Abdominal   Peds  Hematology negative hematology ROS (+)   Anesthesia Other Findings   Reproductive/Obstetrics                             Anesthesia Physical Anesthesia Plan  ASA: III and emergent  Anesthesia Plan: General   Post-op Pain Management:    Induction: Intravenous, Rapid sequence and Cricoid pressure planned  PONV Risk Score and Plan: 3 and Ondansetron, Dexamethasone, Treatment may vary due to age or medical condition and Midazolam  Airway Management Planned: Oral ETT  Additional Equipment: None  Intra-op Plan:   Post-operative Plan: Extubation in OR  Informed Consent: I have reviewed the patients History and Physical, chart, labs and discussed the procedure including the risks, benefits and alternatives for the proposed anesthesia with the patient or authorized representative who has indicated his/her understanding and acceptance.     Dental advisory given  Plan Discussed with:   Anesthesia Plan Comments:         Anesthesia Quick Evaluation  Anesthesia Physical Anesthesia Plan  ASA: 3  Anesthesia Plan: General   Post-op Pain Management:    Induction: Intravenous  PONV Risk Score and Plan: 3 and Treatment may vary due to age or medical condition and Propofol  infusion  Airway Management Planned: Oral ETT  Additional Equipment:   Intra-op Plan:   Post-operative Plan: Extubation in OR  Informed Consent: I have reviewed the patients History and Physical, chart, labs and discussed the procedure including the risks, benefits and alternatives for the proposed anesthesia with the patient or authorized representative who has indicated his/her understanding and acceptance.     Dental advisory given  Plan Discussed with: CRNA and Anesthesiologist  Anesthesia Plan Comments:         Anesthesia Quick Evaluation                                  Anesthesia Evaluation  Patient identified by MRN, date of birth, ID band Patient awake  Reviewed: Allergy & Precautions, NPO status , Patient's Chart, lab work & pertinent test results  History of Anesthesia Complications Negative for: history of anesthetic complications  Airway Mallampati: II  TM Distance: >3 FB Neck ROM: Full    Dental  (+) Edentulous Upper, Missing   Pulmonary neg pulmonary ROS,    Pulmonary exam normal        Cardiovascular negative cardio ROS Normal cardiovascular exam     Neuro/Psych Seizures -,  Cognitive disability- lives in group home negative psych ROS   GI/Hepatic negative GI ROS, Neg liver ROS,   Endo/Other  Morbid obesity  Renal/GU negative Renal ROS  negative genitourinary   Musculoskeletal negative musculoskeletal ROS (+)   Abdominal   Peds  Hematology negative hematology ROS (+)   Anesthesia Other Findings   Reproductive/Obstetrics                             Anesthesia Physical Anesthesia Plan  ASA: III and emergent  Anesthesia Plan: General   Post-op Pain Management:    Induction: Intravenous, Rapid sequence and Cricoid pressure planned  PONV Risk Score and Plan: 3 and Ondansetron, Dexamethasone, Treatment may vary due to age or medical condition and Midazolam  Airway Management  Planned: Oral ETT  Additional Equipment: None  Intra-op Plan:   Post-operative Plan: Extubation in OR  Informed Consent: I have reviewed the patients History and Physical, chart, labs and discussed the procedure including the risks, benefits and alternatives for the proposed anesthesia with the patient or authorized representative who has indicated his/her understanding and acceptance.     Dental advisory given  Plan Discussed with:   Anesthesia Plan Comments:         Anesthesia Quick Evaluation

## 2022-07-21 NOTE — Anesthesia Postprocedure Evaluation (Signed)
Anesthesia Post Note  Patient: Erica Griffin  Procedure(s) Performed: ESOPHAGOGASTRODUODENOSCOPY (EGD) WITH PROPOFOL     Patient location during evaluation: PACU Anesthesia Type: General Level of consciousness: awake and alert Pain management: pain level controlled Vital Signs Assessment: post-procedure vital signs reviewed and stable Respiratory status: spontaneous breathing, nonlabored ventilation, respiratory function stable and patient connected to nasal cannula oxygen Cardiovascular status: blood pressure returned to baseline and stable Postop Assessment: no apparent nausea or vomiting Anesthetic complications: no  No notable events documented.  Last Vitals:  Vitals:   07/21/22 1103 07/21/22 1125  BP: (!) 149/73 132/65  Pulse: 88 84  Resp: 20   Temp:  36.8 C  SpO2: 96% 94%    Last Pain:  Vitals:   07/21/22 1155  TempSrc:   PainSc: 0-No pain                 Trevor Iha

## 2022-07-21 NOTE — Progress Notes (Addendum)
PROGRESS NOTE  Erica Griffin  ZOX:[id number] DOB: [birth year] DOA: 07/20/2022 PCP: [name], MD   Brief Narrative:  Patient is a 72 year old female with history of intellectual disability, allergic rhinitis, dysphagia, history of esophageal stricture with dilatation who presents to the emergency department from [facility] with complaint of dysphagia.  Patient reported persistent nausea, spitting for 2 days.  History of pill esophagitis, follows with [facility].  Reported sensation of foreign body in her throat. GI consulted, underwent EGD today  Assessment & Plan:  Principal Problem:   Dysphagia Active Problems:   Seizure disorder (HCC)  Dysphagia: Has history of chronic dysphagia.  Follows with [facility] as an outpatient, history of dilation in the past.  Presented with complaint of nausea, spitting and with sensation of foreign body in the throat. GI consulted.   EGD showed  distal esophageal ulceration as well as lower esophageal ring. Nonobstructing. Was able  to advance scope without any resistance. No food bolus. GI recommended Protonix 40 mg twice a day for at least next 2 to 3 months,Sucralfate 1 g twice a day for 4 weeks Plan for  repeat EGD with possible dilation of lower esophageal stricture once ulcers are healed.Recommended repeat EGD in 3 months . Diet advanced to full liquid.  We also request speech therapy evaluation  Acute hypoxic respiratory failure/possible aspiration pneumonia: This morning she was requiring 1 L of oxygen.  Chest x-ray with showed bibasilar opacities, possible aspiration pneumonia.  Started on Unasyn.  Will check follow-up x-ray today.  Will try to taper the oxygen and monitor on room air  Hypertension:stable, continue current medications.  On spironolactone, Tenoretic  Hypokalemia: Being supplemented and monitored  Hyperlipidemia: On Lipitor  GERD: Continue PPI  Seizure disorder: Continue home medication.  On Lamictal  Intellectual  disability/debility: Patient is able to communicate.  She lives at nursing facility antibiotics at the help of wheelchair.  Morbid  obesity: BMI 47.3         DVT prophylaxis:SCDs Start: 07/20/22 2356     Code Status: Full Code  Family Communication: None at bedside  Patient status:Obs  Patient is from :Home  Anticipated discharge WJ:XBJY  Estimated DC date:tomorrow   Consultants: GI  Procedures:EGD  Antimicrobials:  Anti-infectives (From admission, onward)    None       Subjective: Patient seen and examined at bedside today. During my evaluation this morning, she was having some coughing spells.  She was on 1 L  of oxygen.  Did not appear to be in respiratory distress.  She is able to communicate.  Objective: Vitals:   07/20/22 2205 07/21/22 0000 07/21/22 0113 07/21/22 0522  BP: 137/64 (!) 130/59 118/65 (!) 151/79  Pulse: 79 72 75 78  Resp: 16 16 14 14   Temp:   97.7 F (36.5 C) 98 F (36.7 C)  TempSrc:   Oral Oral  SpO2: 97% 90% 97% 97%  Weight:   131.1 kg     Intake/Output Summary (Last 24 hours) at 07/21/2022 0755 Last data filed at 07/21/2022 0532 Gross per 24 hour  Intake 1477.31 ml  Output 250 ml  Net 1227.31 ml   Filed Weights   07/21/22 0113  Weight: 131.1 kg    Examination:  General exam: Overall comfortable, not in distress,obese HEENT: PERRL Respiratory system:  no wheezes or crackles , diminished sounds bilaterally none Cardiovascular system: S1 & S2 heard, RRR.  Gastrointestinal system: Abdomen is nondistended, soft and nontender. Central nervous system: Alert and oriented  Extremities:Nonpitting  Edema bilateral lower extremities, no clubbing ,no cyanosis Skin: No rashes, no ulcers,no icterus     Data Reviewed: I have personally reviewed following labs and imaging studies  CBC: Recent Labs  Lab 07/20/22 1933 07/21/22 0616  WBC 8.0 7.0  NEUTROABS 5.4  --   HGB 13.1 12.2  HCT 40.4 38.9  MCV 96.0 100.3*  PLT 289 247    Basic Metabolic Panel: Recent Labs  Lab 07/20/22 2046 07/21/22 0616  NA 137 133*  K 3.6 3.4*  CL 101 99  CO2 27 24  GLUCOSE 103* 101*  BUN 11 9  CREATININE 0.84 0.74  CALCIUM 8.4* 8.1*     No results found for this or any previous visit (from the past 240 hour(s)).   Radiology Studies: DG Chest Port 1 View  Result Date: 07/20/2022 CLINICAL DATA:  Two day history of pill stuck in the throat EXAM: PORTABLE CHEST 1 VIEW COMPARISON:  Chest radiograph dated 06/20/2020 FINDINGS: Low lung volumes. Bibasilar patchy opacities. No pleural effusion or pneumothorax. The heart size and mediastinal contours are within normal limits. No acute osseous abnormality. No radiopaque foreign body. IMPRESSION: 1. No radiopaque foreign body. 2. Low lung volumes with bibasilar patchy opacities, likely atelectasis. Electronically Signed   By: Agustin Cree M.D.   On: 07/20/2022 19:23    Scheduled Meds:  aerochamber Z-Stat Plus/medium       albuterol  2.5 mg Nebulization BID   atenolol-chlorthalidone  0.5 tablet Oral Daily   atorvastatin  10 mg Oral Daily   busPIRone  10 mg Oral BID   doxepin  10 mg Oral QHS   escitalopram  10 mg Oral Daily   lamoTRIgine  150 mg Oral Daily   pantoprazole  40 mg Oral BID   primidone  250 mg Oral QHS   And   primidone  125 mg Oral Daily   spironolactone  25 mg Oral Daily   Continuous Infusions:  dextrose 5% lactated ringers 100 mL/hr at 07/21/22 0428     LOS: 0 days   Burnadette Pop, MD [facility] P5/09/2022, 7:55 AM

## 2022-07-21 NOTE — Progress Notes (Signed)
RT attempted times 2 to deliver nebulizer treatment with no success - PT has been unavailable.

## 2022-07-21 NOTE — Op Note (Signed)
[facility] Patient Name: Erica Griffin Procedure Date: 07/21/2022 MRN: [id number] Attending MD: Kathi Der , MD, [id number] Date of Birth: 1950/05/26 CSN: [id number] Age: 72 Admit Type: Inpatient Procedure:                Upper GI endoscopy Indications:              Dysphagia, Foreign body in the esophagus Providers:                Kathi Der, MD, Margaree Mackintosh, RN,                            Rozetta Nunnery, Technician Referring MD:              Medicines:                Sedation Administered by an Anesthesia Professional Complications:            No immediate complications. Estimated Blood Loss:     Estimated blood loss was minimal. Procedure:                Pre-Anesthesia Assessment:                           - Prior to the procedure, a History and Physical                            was performed, and patient medications and                            allergies were reviewed. The patient's tolerance of                            previous anesthesia was also reviewed. The risks                            and benefits of the procedure and the sedation                            options and risks were discussed with the patient.                            All questions were answered, and informed consent                            was obtained. Prior Anticoagulants: The patient has                            taken no anticoagulant or antiplatelet agents. ASA                            Grade Assessment: III - A patient with severe                            systemic disease. After reviewing the risks and  benefits, the patient was deemed in satisfactory                            condition to undergo the procedure.                           After obtaining informed consent, the endoscope was                            passed under direct vision. Throughout the                            procedure, the patient's blood pressure,  pulse, and                            oxygen saturations were monitored continuously. The                            [id number]-H190 ([id number]) Olympus endoscope was introduced                            through the mouth, and advanced to the second part                            of duodenum. The upper GI endoscopy was                            accomplished without difficulty. The patient                            tolerated the procedure well. Scope In: Scope Out: Findings:      Two superficial esophageal ulcers with no bleeding and no stigmata of       recent bleeding were found at the gastroesophageal junction. The largest       lesion was 15 mm in largest dimension.      A low-grade of narrowing Schatzki ring was found at the gastroesophageal       junction.      A small hiatal hernia was present.      No gross lesions were noted in the entire examined stomach.      The cardia and gastric fundus were normal on retroflexion.      The duodenal bulb, first portion of the duodenum and second portion of       the duodenum were normal. Impression:               - Esophageal ulcers with no bleeding and no                            stigmata of recent bleeding.                           - Low-grade of narrowing Schatzki ring.                           - Small hiatal hernia.                           -  No gross lesions in the entire stomach.                           - Normal duodenal bulb, first portion of the                            duodenum and second portion of the duodenum.                           - No specimens collected. Moderate Sedation:      Moderate (conscious) sedation was personally administered by an       anesthesia professional. The following parameters were monitored: oxygen       saturation, heart rate, blood pressure, and response to care. Recommendation:           - Patient has a contact number available for                            emergencies. The signs and symptoms  of potential                            delayed complications were discussed with the                            patient. Return to normal activities tomorrow.                            Written discharge instructions were provided to the                            patient.                           - Soft diet.                           - Continue present medications.                           - Repeat upper endoscopy in 3 months.                           - Use Protonix (pantoprazole) 40 mg PO BID.                           - Use sucralfate tablets 1 gram PO BID. Procedure Code(s):        --- Professional ---                           [id number], Esophagogastroduodenoscopy, flexible,                            transoral; diagnostic, including collection of                            specimen(s) by brushing or washing, when performed                            (  separate procedure) Diagnosis Code(s):        --- Professional ---                           K22.10, Ulcer of esophagus without bleeding                           K22.2, Esophageal obstruction                           K44.9, Diaphragmatic hernia without obstruction or                            gangrene                           R13.10, Dysphagia, unspecified                           T18.108A, Unspecified foreign body in esophagus                            causing other injury, initial encounter CPT copyright 2022 American Medical Association. All rights reserved. The codes documented in this report are preliminary and upon coder review may  be revised to meet current compliance requirements. Kathi Der, MD Kathi Der, MD 07/21/2022 10:34:09 AM Number of Addenda: 0

## 2022-07-21 NOTE — ED Notes (Signed)
ED TO INPATIENT HANDOFF REPORT  Name/Age/Gender Erica Griffin 72 y.o. female  Code Status    Code Status Orders  (From admission, onward)           Start     Ordered   07/20/22 2324  Full code  Continuous       Question:  By:  Answer:  Consent: discussion documented in EHR   07/20/22 2324           Code Status History     This patient has a current code status but no historical code status.       Home/SNF/Other Nursing Home  Chief Complaint Dysphagia [R13.10]  Level of Care/Admitting Diagnosis ED Disposition     ED Disposition  Admit   Condition  --   Comment  Hospital Area: [facility] [[id number]]  Level of Care: Med-Surg [16]  May place patient in observation at [facility] or [facility] if equivalent level of care is available:: No  Covid Evaluation: Asymptomatic - no recent exposure (last 10 days) testing not required  Diagnosis: Dysphagia [213086]  Admitting Physician: Rometta Emery [[id number]]  Attending Physician: Rometta Emery [[id number]]          Medical History Past Medical History:  Diagnosis Date   Allergic rhinitis    Asthma    Cognitive communication deficit    Dysphagia    Esophageal stricture     Allergies Allergies  Allergen Reactions   Codeine Nausea And Vomiting    IV Location/Drains/Wounds Patient Lines/Drains/Airways Status     Active Line/Drains/Airways     Name Placement date Placement time Site Days   Peripheral IV 07/20/22 20 G 1.88" Anterior;Left Forearm 07/20/22  2012  Forearm  1            Labs/Imaging Results for orders placed or performed during the hospital encounter of 07/20/22 (from the past 48 hour(s))  Urinalysis, Routine w reflex microscopic -Urine, Clean Catch     Status: Abnormal   Collection Time: 07/20/22  7:07 PM  Result Value Ref Range   Color, Urine YELLOW YELLOW   APPearance CLEAR CLEAR   Specific Gravity, Urine 1.013 1.005 - 1.030   pH 7.0 5.0 - 8.0   Glucose,  UA NEGATIVE NEGATIVE mg/dL   Hgb urine dipstick NEGATIVE NEGATIVE   Bilirubin Urine NEGATIVE NEGATIVE   Ketones, ur 5 (A) NEGATIVE mg/dL   Protein, ur NEGATIVE NEGATIVE mg/dL   Nitrite NEGATIVE NEGATIVE   Leukocytes,Ua TRACE (A) NEGATIVE   RBC / HPF 0-5 0 - 5 RBC/hpf   WBC, UA 11-20 0 - 5 WBC/hpf   Bacteria, UA RARE (A) NONE SEEN   Squamous Epithelial / HPF 0-5 0 - 5 /HPF   Budding Yeast PRESENT     Comment: Performed at [facility], [address] [place], [place] [address]  CBC with Differential     Status: None   Collection Time: 07/20/22  7:33 PM  Result Value Ref Range   WBC 8.0 4.0 - 10.5 K/uL   RBC 4.21 3.87 - 5.11 MIL/uL   Hemoglobin 13.1 12.0 - 15.0 g/dL   HCT 96.2 95.2 - 84.1 %   MCV 96.0 80.0 - 100.0 fL   MCH 31.1 26.0 - 34.0 pg   MCHC 32.4 30.0 - 36.0 g/dL   RDW 32.4 40.1 - 02.7 %   Platelets 289 150 - 400 K/uL   nRBC 0.0 0.0 - 0.2 %   Neutrophils Relative %  68 %   Neutro Abs 5.4 1.7 - 7.7 K/uL   Lymphocytes Relative 15 %   Lymphs Abs 1.2 0.7 - 4.0 K/uL   Monocytes Relative 11 %   Monocytes Absolute 0.9 0.1 - 1.0 K/uL   Eosinophils Relative 4 %   Eosinophils Absolute 0.3 0.0 - 0.5 K/uL   Basophils Relative 1 %   Basophils Absolute 0.1 0.0 - 0.1 K/uL   Immature Granulocytes 1 %   Abs Immature Granulocytes 0.04 0.00 - 0.07 K/uL    Comment: Performed at [facility], [address] [place], [place] [address]  Comprehensive metabolic panel     Status: Abnormal   Collection Time: 07/20/22  8:46 PM  Result Value Ref Range   Sodium 137 135 - 145 mmol/L   Potassium 3.6 3.5 - 5.1 mmol/L   Chloride 101 98 - 111 mmol/L   CO2 27 22 - 32 mmol/L   Glucose, Bld 103 (H) 70 - 99 mg/dL    Comment: Glucose reference range applies only to samples taken after fasting for at least 8 hours.   BUN 11 8 - 23 mg/dL   Creatinine, Ser 1.91 0.44 - 1.00 mg/dL   Calcium 8.4 (L) 8.9 - 10.3 mg/dL   Total Protein 7.4 6.5 - 8.1 g/dL   Albumin 3.6  3.5 - 5.0 g/dL   AST 19 15 - 41 U/L   ALT 17 0 - 44 U/L   Alkaline Phosphatase 122 38 - 126 U/L   Total Bilirubin 0.6 0.3 - 1.2 mg/dL   GFR, Estimated >47 >82 mL/min    Comment: (NOTE) Calculated using the CKD-EPI Creatinine Equation (2021)    Anion gap 9 5 - 15    Comment: Performed at [facility], [address] [place], [place] [address]  Lipase, blood     Status: None   Collection Time: 07/20/22  8:46 PM  Result Value Ref Range   Lipase 26 11 - 51 U/L    Comment: Performed at [facility], [address], [place], [place] [address]   DG Chest Port 1 View  Result Date: 07/20/2022 CLINICAL DATA:  Two day history of pill stuck in the throat EXAM: PORTABLE CHEST 1 VIEW COMPARISON:  Chest radiograph dated 06/20/2020 FINDINGS: Low lung volumes. Bibasilar patchy opacities. No pleural effusion or pneumothorax. The heart size and mediastinal contours are within normal limits. No acute osseous abnormality. No radiopaque foreign body. IMPRESSION: 1. No radiopaque foreign body. 2. Low lung volumes with bibasilar patchy opacities, likely atelectasis. Electronically Signed   By: Agustin Cree M.D.   On: 07/20/2022 19:23    Pending Labs Unresulted Labs (From admission, onward)     Start     Ordered   07/21/22 0500  Comprehensive metabolic panel  Tomorrow morning,   R        07/20/22 2355   07/21/22 0500  CBC  Tomorrow morning,   R        07/20/22 2355   07/20/22 2356  HIV Antibody (routine testing w rflx)  (HIV Antibody (Routine testing w reflex) panel)  Once,   R        07/20/22 2355            Vitals/Pain Today's Vitals   07/20/22 2052 07/20/22 2053 07/20/22 2055 07/20/22 2205  BP:    137/64  Pulse: 83 81 81 79  Resp: (!) 23 (!) 23 (!) 22 16  Temp:  TempSrc:      SpO2: (!) 86% (!) 87% 95% 97%    Isolation Precautions No active isolations  Medications Medications  atenolol-chlorthalidone (TENORETIC) 100-25 MG per tablet 0.5 tablet  (has no administration in time range)  atorvastatin (LIPITOR) tablet 10 mg (has no administration in time range)  spironolactone (ALDACTONE) tablet 25 mg (has no administration in time range)  busPIRone (BUSPAR) tablet 10 mg (has no administration in time range)  doxepin (SINEQUAN) capsule 10 mg (has no administration in time range)  escitalopram (LEXAPRO) tablet 10 mg (has no administration in time range)  pantoprazole (PROTONIX) EC tablet 40 mg (has no administration in time range)  lamoTRIgine (LAMICTAL) tablet 150 mg (has no administration in time range)  primidone (MYSOLINE) tablet 125-250 mg (has no administration in time range)  topiramate (TOPAMAX) tablet 50 mg (has no administration in time range)  albuterol (VENTOLIN HFA) 108 (90 Base) MCG/ACT inhaler 2 puff (has no administration in time range)  dextrose 5 % in lactated ringers infusion (has no administration in time range)  ondansetron (ZOFRAN) tablet 4 mg (has no administration in time range)    Or  ondansetron (ZOFRAN) injection 4 mg (has no administration in time range)  sodium chloride 0.9 % bolus 1,000 mL (1,000 mLs Intravenous New Bag/Given 07/20/22 2014)  ondansetron (ZOFRAN) injection 4 mg (4 mg Intravenous Given 07/20/22 2014)  0.9 %  sodium chloride infusion ( Intravenous New Bag/Given 07/20/22 2232)    Mobility non-ambulatory

## 2022-07-21 NOTE — Brief Op Note (Signed)
07/21/2022  10:47 AM  PATIENT:  Rod Can  72 y.o. female  PRE-OPERATIVE DIAGNOSIS:  Food impaction  POST-OPERATIVE DIAGNOSIS:  esophageal ulcer  PROCEDURE:  Procedure(s): ESOPHAGOGASTRODUODENOSCOPY (EGD) WITH PROPOFOL (N/A)  SURGEON:  Surgeon(s) and Role:    * Lakeitha Basques, MD - Primary  Findings ----------- -EGD showed distal esophageal ulceration as well as lower esophageal ring.  Nonobstructing.  Able to advance scope without any resistance.  No food bolus.  Recommendations ------------------------ -Protonix 40 mg twice a day for at least next 2 to 3 months -Sucralfate 1 g twice a day for 4 weeks -She may benefit from repeat EGD with possible dilation of lower esophageal stricture once ulcers are healed. -Recommend repeat EGD in 3 months -Okay to discharge from GI standpoint.  GI will sign off.   call us back if needed.   Kathi Der MD, FACP 07/21/2022, 10:48 AM  Contact #  [phone]

## 2022-07-21 NOTE — Anesthesia Procedure Notes (Signed)
Procedure Name: Intubation Date/Time: 07/21/2022 10:20 AM  Performed by: Lovie Chol, CRNAPre-anesthesia Checklist: Patient identified, Emergency Drugs available, Suction available and Patient being monitored Patient Re-evaluated:Patient Re-evaluated prior to induction Oxygen Delivery Method: Circle System Utilized Preoxygenation: Pre-oxygenation with 100% oxygen Induction Type: IV induction, Rapid sequence and Cricoid Pressure applied Laryngoscope Size: Miller and 3 Grade View: Grade I Tube type: Oral Tube size: 7.0 mm Number of attempts: 1 Airway Equipment and Method: Stylet Placement Confirmation: ETT inserted through vocal cords under direct vision, positive ETCO2 and breath sounds checked- equal and bilateral Secured at: 21 cm Tube secured with: Tape Dental Injury: Teeth and Oropharynx as per pre-operative assessment

## 2022-07-21 NOTE — Transfer of Care (Signed)
Immediate Anesthesia Transfer of Care Note  Patient: Erica Griffin  Procedure(s) Performed: ESOPHAGOGASTRODUODENOSCOPY (EGD) WITH PROPOFOL  Patient Location: Endoscopy Unit  Anesthesia Type:General  Level of Consciousness: awake, oriented, and patient cooperative  Airway & Oxygen Therapy: Patient Spontanous Breathing and Patient connected to face mask oxygen  Post-op Assessment: Report given to RN and Post -op Vital signs reviewed and stable  Post vital signs: Reviewed  Last Vitals:  Vitals Value Taken Time  BP    Temp    Pulse    Resp    SpO2      Last Pain:  Vitals:   07/21/22 0926  TempSrc: Oral  PainSc: 0-No pain         Complications: No notable events documented.

## 2022-07-22 DIAGNOSIS — R1310 Dysphagia, unspecified: Secondary | ICD-10-CM | POA: Diagnosis not present

## 2022-07-22 LAB — BASIC METABOLIC PANEL
Anion gap: 5 (ref 5–15)
BUN: 7 mg/dL — ABNORMAL LOW (ref 8–23)
CO2: 31 mmol/L (ref 22–32)
Calcium: 8.4 mg/dL — ABNORMAL LOW (ref 8.9–10.3)
Chloride: 100 mmol/L (ref 98–111)
Creatinine, Ser: 0.75 mg/dL (ref 0.44–1.00)
GFR, Estimated: >60 mL/min (ref >60)
Glucose, Bld: 105 mg/dL — ABNORMAL HIGH (ref 70–99)
Potassium: 3.5 mmol/L (ref 3.5–5.1)
Sodium: 136 mmol/L (ref 135–145)

## 2022-07-22 MED ORDER — ACETAMINOPHEN 325 MG PO TABS
650.0000 mg | ORAL_TABLET | Freq: Once | ORAL | Status: AC
  Administered 2022-07-22: 650 mg via ORAL
  Filled 2022-07-22: qty 2

## 2022-07-22 MED ORDER — PREDNISONE 20 MG PO TABS
40.0000 mg | ORAL_TABLET | Freq: Every day | ORAL | Status: DC
  Administered 2022-07-23: 40 mg via ORAL
  Filled 2022-07-22: qty 2

## 2022-07-22 MED ORDER — POTASSIUM CHLORIDE CRYS ER 20 MEQ PO TBCR
40.0000 meq | EXTENDED_RELEASE_TABLET | Freq: Once | ORAL | Status: AC
  Administered 2022-07-22: 40 meq via ORAL
  Filled 2022-07-22: qty 2

## 2022-07-22 MED ORDER — IPRATROPIUM-ALBUTEROL 0.5-2.5 (3) MG/3ML IN SOLN
3.0000 mL | Freq: Four times a day (QID) | RESPIRATORY_TRACT | Status: DC
  Administered 2022-07-22 – 2022-07-23 (×3): 3 mL via RESPIRATORY_TRACT
  Filled 2022-07-22 (×4): qty 3

## 2022-07-22 MED ORDER — OXYCODONE HCL 5 MG PO TABS
5.0000 mg | ORAL_TABLET | Freq: Four times a day (QID) | ORAL | Status: DC | PRN
  Administered 2022-07-22 (×2): 5 mg via ORAL
  Filled 2022-07-22 (×2): qty 1

## 2022-07-22 NOTE — TOC Progression Note (Addendum)
Transition of Care [facility]) - Progression Note    Patient Details  Name: Erica Griffin MRN: [id number] Date of Birth: 04-01-1950  Transition of Care [employer] [facility]) CM/SW Contact  Beckie Busing, RN Phone [phone]  07/22/2022, 2:08 PM  Clinical Narrative:    Patient admitted from [facility]. CM has attempted to call [facility] to verify message has been left for [facility] in admissions.   [facility] admissions at [facility] verifies that patient is able to return to facility once medically stable for d/c.       Expected Discharge Plan and Services                                               Social Determinants of Health (SDOH) Interventions SDOH Screenings   Tobacco Use: Low Risk  (07/21/2022)    Readmission Risk Interventions     No data to display

## 2022-07-22 NOTE — Evaluation (Addendum)
Clinical/Bedside Swallow Evaluation Patient Details  Name: Erica Griffin MRN: [id number] Date of Birth: 07/07/1950  Today's Date: 07/22/2022 Time: SLP Start Time (ACUTE ONLY): 1041 SLP Stop Time (ACUTE ONLY): 1115 SLP Time Calculation (min) (ACUTE ONLY): 34 min  Past Medical History:  Past Medical History:  Diagnosis Date   Allergic rhinitis    Asthma    Cognitive communication deficit    Dysphagia    Esophageal stricture    Past Surgical History:  Past Surgical History:  Procedure Laterality Date   BIOPSY  07/09/2020   Procedure: BIOPSY;  Surgeon: Kathi Der, MD;  Location: [facility] ENDOSCOPY;  Service: Gastroenterology;;   CHOLECYSTECTOMY     ESOPHAGEAL DILATION  07/09/2020   Procedure: ESOPHAGEAL DILATION;  Surgeon: Kathi Der, MD;  Location: [facility];  Service: Gastroenterology;;   ESOPHAGOGASTRODUODENOSCOPY (EGD) WITH PROPOFOL N/A 04/25/2020   Procedure: ESOPHAGOGASTRODUODENOSCOPY (EGD) WITH PROPOFOL;  Surgeon: Kathi Der, MD;  Location: [facility];  Service: Gastroenterology;  Laterality: N/A;   ESOPHAGOGASTRODUODENOSCOPY (EGD) WITH PROPOFOL N/A 07/09/2020   Procedure: ESOPHAGOGASTRODUODENOSCOPY (EGD) WITH PROPOFOL;  Surgeon: Kathi Der, MD;  Location: [facility];  Service: Gastroenterology;  Laterality: N/A;   FOREIGN BODY REMOVAL  04/25/2020   Procedure: FOREIGN BODY REMOVAL;  Surgeon: Kathi Der, MD;  Location: [facility];  Service: Gastroenterology;;   HPI:  Per "Md notes "72 year old female with history of intellectual disability, allergic rhinitis, dysphagia, history of esophageal stricture with dilatation who presents to the emergency department from [facility] with complaint of dysphagia.  Patient reported persistent nausea, spitting for 2 days."    History of pill esophagitis, s/p endoscopy, 07/21/2022 no food stasis.  Esophageal ulcers with no bleeding and no stigmata of recent bleeding.- Low-grade of narrowing Schatzki ring- Small hiatal  hernia. No gross lesions in the entire stomach GI notes s/p EGD read sa follows "She may benefit from repeat EGD with possible dilation of lower esophageal stricture once ulcers are healed"   - Use Protonix (pantoprazole) 40 mg PO BID.  Use sucralfate tablets 1 gram PO BID.  MBS 12/2020 normal: rec reg/thin, CXR hypoinflation, nothing acute.  Hospitalist ordered SLP swallow evaluation.    Assessment / Plan / Recommendation  Clinical Impression  Functional oropharyngeal swallow ability present. Pt is edentulous and this coupled with her esophageal deficits, prompt need for Dys3/mechanical soft diet *Per GI and SLP agrees*. Pt reports she used to wear dentures to eat but lost them and they will not be replaced. NO indication of aspiration or dysphagia with liquids, oral manipulation with solids consistent with dysphagia with ID with post-swallow cough x1.  Coughing noted on MBS in 12/2020 without aspiration or penetration observed, therefore rec continue diet. Reviewed recommendations from GI MD with pt.  No SLP follow up indicated. Messaged Holli, RN, with findings/recommendations. Thanks. SLP Visit Diagnosis: Dysphagia, unspecified (R13.10)    Aspiration Risk  Mild aspiration risk    Diet Recommendation Dysphagia 3 (Mech soft);Thin liquid   Liquid Administration via: Cup;Straw Medication Administration: Whole meds with liquid Supervision: Patient able to self feed Compensations: Slow rate;Small sips/bites Postural Changes: Seated upright at 90 degrees;Remain upright for at least 30 minutes after po intake    Other  Recommendations Oral Care Recommendations: Oral care BID    Recommendations for follow up therapy are one component of a multi-disciplinary discharge planning process, led by the attending physician.  Recommendations may be updated based on patient status, additional functional criteria and insurance authorization.  Follow up Recommendations No SLP follow up  Assistance  Recommended at Discharge    Functional Status Assessment Patient has not had a recent decline in their functional status  Frequency and Duration            Prognosis        Swallow Study   General Date of Onset: 07/22/22 HPI: Per "Md notes "72 year old female with history of intellectual disability, allergic rhinitis, dysphagia, history of esophageal stricture with dilatation who presents to the emergency department from [facility] with complaint of dysphagia.  Patient reported persistent nausea, spitting for 2 days."    History of pill esophagitis, s/p endoscopy, 07/21/2022 no food stasis.  Esophageal ulcers with no bleeding and no stigmata of recent bleeding.- Low-grade of narrowing Schatzki ring- Small hiatal hernia. No gross lesions in the entire stomach GI notes s/p EGD read sa follows "She may benefit from repeat EGD with possible dilation of lower esophageal stricture once ulcers are healed"   - Use Protonix (pantoprazole) 40 mg PO BID.  Use sucralfate tablets 1 gram PO BID.  MBS 12/2020 normal: rec reg/thin, CXR hypoinflation, nothing acute.  Hospitalist ordered SLP swallow evaluation. Type of Study: Bedside Swallow Evaluation Previous Swallow Assessment: see HPI Diet Prior to this Study: Dysphagia 3 (mechanical soft);Thin liquids (Level 0) Temperature Spikes Noted: No Respiratory Status: Room air History of Recent Intubation: No Behavior/Cognition: Alert;Cooperative;Pleasant mood Oral Cavity Assessment: Within Functional Limits Oral Care Completed by SLP: No Oral Cavity - Dentition: Edentulous Vision: Functional for self-feeding Self-Feeding Abilities: Able to feed self Patient Positioning: Upright in bed Baseline Vocal Quality: Normal Volitional Cough: Strong Volitional Swallow: Able to elicit    Oral/Motor/Sensory Function Overall Oral Motor/Sensory Function: Other (comment) (dysarthria, lingual discoordination due to ID)   Ice Chips Ice chips: Not tested   Thin Liquid  Thin Liquid: Within functional limits Presentation: Self Fed;Straw;Cup Other Comments: 3 ounce yale passed    Nectar Thick Nectar Thick Liquid: Not tested   Honey Thick Honey Thick Liquid: Not tested   Puree Puree: Within functional limits Presentation: Self Fed;Spoon   Solid     Solid: Within functional limits Presentation: Self Fed Other Comments: delayed subtle cough x1 noted - pt reports this occurs on occasion and she has no dentures as they were lost, she reports she will not obtain more due to inadequate jaw structure      Chales Abrahams 07/22/2022,12:51 PM Rolena Infante, MS [facility] SLP Acute Rehab Services Office [phone]

## 2022-07-22 NOTE — Evaluation (Signed)
Physical Therapy Evaluation Patient Details Name: Erica Griffin MRN: [id number] DOB: 09-12-1950 Today's Date: 07/22/2022  History of Present Illness  Pt is 72 yo female admitted on 07/20/22 with dysphagia and aspiration PNE. Pt with hx including but not limited to intellectual disability, allergic rhinitis, dysphagia, history of esophageal stricture with dilatation  Clinical Impression  Pt admitted with above diagnosis. Pt is resident of [facility] but reports is normally able to stand into STEDY type lift for transfer to w/c of which she can propel.  Today, pt was limited by severe R foot and calf pain.  Pt reports someone lowered tray table on it and since has had severe pain.  Pain was in entire foot and calf with movement and gentle palpation.  No edema, erythema, ecchymosis noted.  Pt attempted to sit EOB but crying in pain from R foot so returned to supine, elevated leg, notified MD.  She if from LTC but is below baseline so will benefit from therapy.  Pt currently with functional limitations due to the deficits listed below (see PT Problem List). Pt will benefit from acute skilled PT to increase their independence and safety with mobility to allow discharge.          Recommendations for follow up therapy are one component of a multi-disciplinary discharge planning process, led by the attending physician.  Recommendations may be updated based on patient status, additional functional criteria and insurance authorization.  Follow Up Recommendations Can patient physically be transported by private vehicle: No     Assistance Recommended at Discharge Frequent or constant Supervision/Assistance  Patient can return home with the following  Two people to help with walking and/or transfers;Two people to help with bathing/dressing/bathroom    Equipment Recommendations None recommended by PT  Recommendations for Other Services       Functional Status Assessment Patient has had a recent decline  in their functional status and demonstrates the ability to make significant improvements in function in a reasonable and predictable amount of time.     Precautions / Restrictions Precautions Precautions: Fall      Mobility  Bed Mobility Overal bed mobility: Needs Assistance Bed Mobility: Supine to Sit           General bed mobility comments: started to attempt but pt crying with R foot and calf pain    Transfers                        Ambulation/Gait                  Stairs            Wheelchair Mobility    Modified Rankin (Stroke Patients Only)       Balance       Sitting balance - Comments: unable today                                     Pertinent Vitals/Pain Pain Assessment Pain Assessment: 0-10 Pain Score: 10-Worst pain ever Pain Location: R foot Pain Descriptors / Indicators: Discomfort, Sharp Pain Intervention(s): Limited activity within patient's tolerance, Monitored during session    Home Living Family/patient expects to be discharged to:: Skilled nursing facility                   Additional Comments: From LTC [facility]    Prior Function  Mobility Comments: Does not walk; Uses manual w/c that she is able to push; Reports using STEDY type lift and she can pull up for pivot ADLs Comments: Has assist with all adls     Hand Dominance        Extremity/Trunk Assessment   Upper Extremity Assessment Upper Extremity Assessment: Overall WFL for tasks assessed    Lower Extremity Assessment Lower Extremity Assessment: LLE deficits/detail;RLE deficits/detail RLE Deficits / Details: Limited by pain.  Reports during admission someone lowered tray table onto her foot and severe pain since.  With gentle palpation pain is in entire foot and calf but not anterior lower leg.  Also very painful with any movement.  Limited ROM at ankle due to pain.  MMT: not able to test due to pain other than  1/5 throughout.  Did not note any edema, erythema, or ecchymosis.  Notified MD. LLE Deficits / Details: ROM WFL; MMT at least 3/5 throughout    Cervical / Trunk Assessment Cervical / Trunk Assessment: Kyphotic  Communication   Communication: Expressive difficulties  Cognition Arousal/Alertness: Awake/alert Behavior During Therapy: WFL for tasks assessed/performed Overall Cognitive Status: History of cognitive impairments - at baseline                                 General Comments: Pt with dysarthria and difficult to understand but following commands and answering questions appropriately.        General Comments      Exercises     Assessment/Plan    PT Assessment Patient needs continued PT services  PT Problem List Decreased strength;Pain;Cardiopulmonary status limiting activity;Decreased range of motion;Decreased cognition;Decreased activity tolerance;Decreased balance;Decreased mobility;Decreased knowledge of precautions;Decreased knowledge of use of DME       PT Treatment Interventions DME instruction;Therapeutic exercise;Therapeutic activities;Patient/family education;Functional mobility training;Neuromuscular re-education;Modalities;Balance training;Wheelchair mobility training    PT Goals (Current goals can be found in the Care Plan section)  Acute Rehab PT Goals Patient Stated Goal: decrease pain PT Goal Formulation: With patient Time For Goal Achievement: 08/05/22 Potential to Achieve Goals: Poor    Frequency Min 1X/week     Co-evaluation               AM-PAC PT "6 Clicks" Mobility  Outcome Measure Help needed turning from your back to your side while in a flat bed without using bedrails?: Total Help needed moving from lying on your back to sitting on the side of a flat bed without using bedrails?: Total Help needed moving to and from a bed to a chair (including a wheelchair)?: Total Help needed standing up from a chair using your arms  (e.g., wheelchair or bedside chair)?: Total Help needed to walk in hospital room?: Total Help needed climbing 3-5 steps with a railing? : Total 6 Click Score: 6    End of Session   Activity Tolerance: Patient limited by pain Patient left: in bed;with call bell/phone within reach;with bed alarm set Nurse Communication: Mobility status;Need for lift equipment PT Visit Diagnosis: Unsteadiness on feet (R26.81);Muscle weakness (generalized) (M62.81);Pain Pain - Right/Left: Right Pain - part of body: Ankle and joints of foot    Time: 1610-9604 PT Time Calculation (min) (ACUTE ONLY): 13 min   Charges:   PT Evaluation $PT Eval Low Complexity: 1 Low          Alayziah Tangeman, PT Acute Rehab [facility] [phone]   Rayetta Humphrey 07/22/2022, 3:04 PM

## 2022-07-22 NOTE — Progress Notes (Signed)
PROGRESS NOTE  Erica Griffin  ZOX:[id number] DOB: [birth year] DOA: 07/20/2022 PCP: [name], MD   Brief Narrative:  Patient is a 72 year old female with history of intellectual disability, allergic rhinitis, dysphagia, history of esophageal stricture with dilatation who presents to the emergency department from [facility] with complaint of dysphagia.  Patient reported persistent nausea, spitting for 2 days.  History of pill esophagitis, follows with [facility].  Reported sensation of foreign body in her throat. GI consulted, underwent EGD .  Aspiration pneumonia  also suspected, started on Unasyn.  Hospital course remarkable for requirement of 1 to 2 L of oxygen, trying to wean.  Plan for DC back to SNF tomorrow after PT assessment  Assessment & Plan:  Principal Problem:   Dysphagia Active Problems:   Seizure disorder (HCC)  Dysphagia: Has history of chronic dysphagia.  Follows with [facility] as an outpatient, history of dilation in the past.  Presented with complaint of nausea, spitting and with sensation of foreign body in the throat. GI consulted.   EGD showed  distal esophageal ulceration as well as lower esophageal ring. Nonobstructing.. No food bolus. GI recommended Protonix 40 mg twice a day for at least next 2 to 3 months,Sucralfate 1 g twice a day for 4 weeks Plan for  repeat EGD with possible dilation of lower esophageal stricture once ulcers are healed.Recommended repeat EGD in 3 months .  speech therapist recommended dysphagia 3 diet  Acute hypoxic respiratory failure/possible aspiration pneumonia: This morning she was requiring 2 L of oxygen.  Chest x-ray with showed bibasilar opacities on 5/6, follow up chest x-ray on 5/7 did not show any pneumonia Currently  on Unasyn.   Will try to taper the oxygen and monitor on room air. Noted to be wheezing today, started on steroids, bronchodilators  Hypertension:stable, continue current medications.  On spironolactone,  Tenoretic  Hypokalemia: Supplemented and corrected  Hyperlipidemia: On Lipitor  GERD: Continue PPI  Seizure disorder: Continue home medication.  On Lamictal  Intellectual disability/debility: Patient is able to communicate.  She lives at nursing facility, ambulates with the  help of wheelchair.  TOC consulted.  PT consulted  Morbid  obesity: BMI 47.3         DVT prophylaxis:SCDs Start: 07/20/22 2356     Code Status: Full Code  Family Communication: Called and discussed with relative Darlyne Russian  ( cousin) on 5/8  Patient status:Obs  Patient is from : SNF  Anticipated discharge WJ:XBJY  Estimated DC date:tomorrow   Consultants: GI  Procedures:EGD  Antimicrobials:  Anti-infectives (From admission, onward)    Start     Dose/Rate Route Frequency Ordered Stop   07/21/22 1000  Ampicillin-Sulbactam (UNASYN) 3 g in sodium chloride 0.9 % 100 mL IVPB        3 g 200 mL/hr over 30 Minutes Intravenous Every 6 hours 07/21/22 0909         Subjective: Patient seen and examined at bedside today.  Hemodynamically stable.  She appeared  overall comfortable but requiring 2 L of oxygen.  Continuously coughing during evaluation , auscultation revealed bilateral wheezing  Objective: Vitals:   07/21/22 2006 07/22/22 0559 07/22/22 0820 07/22/22 1016  BP:  (!) 124/57    Pulse:  66  66  Resp:  18    Temp:  97.8 F (36.6 C)    TempSrc:  Oral    SpO2: 97% 100% 99% 100%  Weight:      Height:  Intake/Output Summary (Last 24 hours) at 07/22/2022 1347 Last data filed at 07/22/2022 0918 Gross per 24 hour  Intake --  Output 1800 ml  Net -1800 ml   Filed Weights   07/21/22 0113 07/21/22 0926  Weight: 131.1 kg 133 kg    Examination:   General exam: Overall comfortable, not in distress, obese HEENT: PERRL Respiratory system: Diminished sounds bilaterally, bilateral wheezing Cardiovascular system: S1 & S2 heard, RRR.  Gastrointestinal system: Abdomen is  nondistended, soft and nontender. Central nervous system: Alert and awake Extremities: No edema, no clubbing ,no cyanosis Skin: No rashes, no ulcers,no icterus     Data Reviewed: I have personally reviewed following labs and imaging studies  CBC: Recent Labs  Lab 07/20/22 1933 07/21/22 0616  WBC 8.0 7.0  NEUTROABS 5.4  --   HGB 13.1 12.2  HCT 40.4 38.9  MCV 96.0 100.3*  PLT 289 247   Basic Metabolic Panel: Recent Labs  Lab 07/20/22 2046 07/21/22 0616 07/22/22 0623  NA 137 133* 136  K 3.6 3.4* 3.5  CL 101 99 100  CO2 27 24 31   GLUCOSE 103* 101* 105*  BUN 11 9 7*  CREATININE 0.84 0.74 0.75  CALCIUM 8.4* 8.1* 8.4*     No results found for this or any previous visit (from the past 240 hour(s)).   Radiology Studies: DG CHEST PORT 1 VIEW  Result Date: 07/21/2022 CLINICAL DATA:  Shortness of breath. EXAM: PORTABLE CHEST 1 VIEW COMPARISON:  Jul 20, 2022. FINDINGS: Stable cardiomediastinal silhouette. Lungs are clear. Bony thorax is unremarkable. Hypoinflation of the lungs is noted. IMPRESSION: Hypoinflation of the lungs. No acute cardiopulmonary abnormality seen. Electronically Signed   By: Lupita Raider M.D.   On: 07/21/2022 13:28   DG Chest Port 1 View  Result Date: 07/20/2022 CLINICAL DATA:  Two day history of pill stuck in the throat EXAM: PORTABLE CHEST 1 VIEW COMPARISON:  Chest radiograph dated 06/20/2020 FINDINGS: Low lung volumes. Bibasilar patchy opacities. No pleural effusion or pneumothorax. The heart size and mediastinal contours are within normal limits. No acute osseous abnormality. No radiopaque foreign body. IMPRESSION: 1. No radiopaque foreign body. 2. Low lung volumes with bibasilar patchy opacities, likely atelectasis. Electronically Signed   By: Agustin Cree M.D.   On: 07/20/2022 19:23    Scheduled Meds:  atenolol  50 mg Oral Daily   And   chlorthalidone  12.5 mg Oral Daily   atorvastatin  10 mg Oral Daily   busPIRone  10 mg Oral BID   doxepin  10 mg Oral  QHS   escitalopram  10 mg Oral Daily   ipratropium-albuterol  3 mL Nebulization Q6H   lamoTRIgine  100 mg Oral BID   pantoprazole  40 mg Oral BID   [START ON 07/23/2022] predniSONE  40 mg Oral Q breakfast   primidone  250 mg Oral QHS   And   primidone  125 mg Oral Daily   spironolactone  25 mg Oral Daily   sucralfate  1 g Oral BID   Continuous Infusions:  ampicillin-sulbactam (UNASYN) IV 3 g (07/22/22 1012)     LOS: 0 days   Burnadette Pop, MD [facility] P5/10/2022, 1:47 PM

## 2022-07-22 NOTE — Care Management Obs Status (Signed)
MEDICARE OBSERVATION STATUS NOTIFICATION   Patient Details  Name: Erica Griffin MRN: [id number] Date of Birth: 15-Sep-1950   Medicare Observation Status Notification Given:  Yes    Beckie Busing, RN 07/22/2022, 12:24 PM

## 2022-07-23 ENCOUNTER — Observation Stay (HOSPITAL_COMMUNITY): Payer: Medicare Other

## 2022-07-23 ENCOUNTER — Encounter (HOSPITAL_COMMUNITY): Payer: Self-pay | Admitting: Gastroenterology

## 2022-07-23 DIAGNOSIS — R1310 Dysphagia, unspecified: Secondary | ICD-10-CM | POA: Diagnosis not present

## 2022-07-23 LAB — BASIC METABOLIC PANEL
Anion gap: 8 (ref 5–15)
BUN: 7 mg/dL — ABNORMAL LOW (ref 8–23)
CO2: 31 mmol/L (ref 22–32)
Calcium: 8.7 mg/dL — ABNORMAL LOW (ref 8.9–10.3)
Chloride: 97 mmol/L — ABNORMAL LOW (ref 98–111)
Creatinine, Ser: 0.86 mg/dL (ref 0.44–1.00)
GFR, Estimated: >60 mL/min (ref >60)
Glucose, Bld: 107 mg/dL — ABNORMAL HIGH (ref 70–99)
Potassium: 3.5 mmol/L (ref 3.5–5.1)
Sodium: 136 mmol/L (ref 135–145)

## 2022-07-23 MED ORDER — AMOXICILLIN-POT CLAVULANATE 875-125 MG PO TABS: 1.0000 | ORAL_TABLET | Freq: Two times a day (BID) | ORAL | Status: DC

## 2022-07-23 MED ORDER — IPRATROPIUM-ALBUTEROL 0.5-2.5 (3) MG/3ML IN SOLN: 3.0000 mL | Freq: Four times a day (QID) | RESPIRATORY_TRACT | Status: DC

## 2022-07-23 MED ORDER — PREDNISONE 20 MG PO TABS: 40.0000 mg | ORAL_TABLET | Freq: Every day | ORAL | 0 refills | Status: AC

## 2022-07-23 MED ORDER — POTASSIUM CHLORIDE CRYS ER 20 MEQ PO TBCR: 40.0000 meq | EXTENDED_RELEASE_TABLET | Freq: Every day | ORAL | Status: AC

## 2022-07-23 MED ORDER — SUCRALFATE 1 GM/10ML PO SUSP: 1.0000 g | Freq: Two times a day (BID) | ORAL | 0 refills | Status: AC

## 2022-07-23 MED ORDER — AMOXICILLIN-POT CLAVULANATE 875-125 MG PO TABS: 1.0000 | ORAL_TABLET | Freq: Two times a day (BID) | ORAL | 0 refills | Status: AC

## 2022-07-23 NOTE — TOC Transition Note (Signed)
Transition of Care [facility]) - CM/SW Discharge Note   Patient Details  Name: Erica Griffin MRN: [id number] Date of Birth: 07-Jul-1950  [facility]) CM/SW Contact:  Beckie Busing, RN Phone Number:[phone]   07/23/2022, 12:04 PM   Clinical Narrative:    Patient with discharge orders to return back to [facility]. Patient made aware. CM attempted to contact patient contact Eddie North with no answer. Message left will await return call. Transportation set up with [facility]. D/C packet is at nurses station. Nurse has been updated.  Please call report to  [facility] [phone] Room # 405A   Final next level of care: Long Term Nursing Home Barriers to Discharge: No Barriers Identified   Patient Goals and CMS Choice CMS Medicare.gov Compare Post Acute Care list provided to:: Patient Choice offered to / list presented to : Patient (Patient wants to return to [facility])  Discharge Placement                Patient chooses bed at: [facility] (Return to [place]) Patient to be transferred to facility by: [facility] Name of family member notified: Darlyne Russian [phone] no answer message left to return call will await return call Patient and family notified of of transfer: 07/23/22  Discharge Plan and Services Additional resources added to the After Visit Summary for                  DME Arranged: N/A DME Agency: NA       HH Arranged: NA HH Agency: NA        Social Determinants of Health (SDOH) Interventions SDOH Screenings   Tobacco Use: Low Risk  (07/23/2022)     Readmission Risk Interventions     No data to display

## 2022-07-23 NOTE — NC FL2 (Signed)
[place] MEDICAID FL2 LEVEL OF CARE FORM     IDENTIFICATION  Patient Name: Erica Griffin Birthdate: June 28, 1950 Sex: female Admission Date (Current Location): 07/20/2022  [place] and [place] Number:  [facility] Bast [id number] P Facility and Address:  [facility],  [address], [place] [address]      Provider Number: [id number]  Attending Physician Name and Address:  Burnadette Pop, MD  Relative Name and Phone Number:  Darlyne Russian [phone]    Current Level of Care: Hospital Recommended Level of Care: Skilled Nursing Facility Prior Approval Number:    Date Approved/Denied:   PASRR Number: [id number]  Discharge Plan: SNF    Current Diagnoses: Patient Active Problem List   Diagnosis Date Noted   Chronic cough 07/19/2020   Dysphagia 07/19/2020   Seizure disorder (HCC) 06/23/2018    Orientation RESPIRATION BLADDER Height & Weight     Self, Time, Situation, Place  Normal Incontinent Weight: 133 kg Height:  5\' 6"  (167.6 cm)  BEHAVIORAL SYMPTOMS/MOOD NEUROLOGICAL BOWEL NUTRITION STATUS     (n/a) Incontinent Diet  AMBULATORY STATUS COMMUNICATION OF NEEDS Skin   Extensive Assist Verbally Other (Comment) (redness noted to abdomen buttocks arm bilaterally)                       Personal Care Assistance Level of Assistance  Bathing, Feeding, Dressing Bathing Assistance: Maximum assistance Feeding assistance: Limited assistance (set up) Dressing Assistance: Limited assistance     Functional Limitations Info  Sight, Hearing, Speech Sight Info: Adequate Hearing Info: Adequate Speech Info: Impaired (slurred , dysarthria)    SPECIAL CARE FACTORS FREQUENCY  PT (By licensed PT), OT (By licensed OT)     PT Frequency: 3X/wk OT Frequency: 3X/wk            Contractures Contractures Info: Not present    Additional Factors Info  Code Status, Allergies, Psychotropic, Insulin Sliding Scale, Isolation Precautions, Suctioning Needs Code Status Info:  Full Allergies Info: Codeine Psychotropic Info: see d/c summary Insulin Sliding Scale Info: see d/c summary Isolation Precautions Info: n/a Suctioning Needs: n/a   Current Medications (07/23/2022):  This is the current hospital active medication list Current Facility-Administered Medications  Medication Dose Route Frequency Provider Last Rate Last Admin   amoxicillin-clavulanate (AUGMENTIN) 875-125 MG per tablet 1 tablet  1 tablet Oral Q12H Adhikari, Amrit, MD       atenolol (TENORMIN) tablet 50 mg  50 mg Oral Daily Adhikari, Amrit, MD   50 mg at 07/23/22 1012   And   chlorthalidone (HYGROTON) tablet 12.5 mg  12.5 mg Oral Daily Adhikari, Amrit, MD   12.5 mg at 07/23/22 1012   atorvastatin (LIPITOR) tablet 10 mg  10 mg Oral Daily Earlie Lou L, MD   10 mg at 07/23/22 1012   busPIRone (BUSPAR) tablet 10 mg  10 mg Oral BID Earlie Lou L, MD   10 mg at 07/22/22 2107   doxepin (SINEQUAN) capsule 10 mg  10 mg Oral QHS Earlie Lou L, MD   10 mg at 07/22/22 2107   escitalopram (LEXAPRO) tablet 10 mg  10 mg Oral Daily Earlie Lou L, MD   10 mg at 07/23/22 1012   hydrocortisone cream 1 % 1 Application  1 Application Topical TID PRN Burnadette Pop, MD   1 Application at 07/22/22 1013   ipratropium-albuterol (DUONEB) 0.5-2.5 (3) MG/3ML nebulizer solution 3 mL  3 mL Nebulization Q6H Adhikari, Amrit, MD   3 mL at 07/23/22 0118   lamoTRIgine (  LAMICTAL) tablet 100 mg  100 mg Oral BID Burnadette Pop, MD   100 mg at 07/23/22 1012   ondansetron (ZOFRAN) tablet 4 mg  4 mg Oral Q6H PRN Rometta Emery, MD       Or   ondansetron (ZOFRAN) injection 4 mg  4 mg Intravenous Q6H PRN Rometta Emery, MD       oxyCODONE (Oxy IR/ROXICODONE) immediate release tablet 5 mg  5 mg Oral Q6H PRN Burnadette Pop, MD   5 mg at 07/22/22 2107   pantoprazole (PROTONIX) EC tablet 40 mg  40 mg Oral BID Earlie Lou L, MD   40 mg at 07/23/22 1012   predniSONE (DELTASONE) tablet 40 mg  40 mg Oral Q breakfast  Burnadette Pop, MD   40 mg at 07/23/22 1012   primidone (MYSOLINE) tablet 250 mg  250 mg Oral QHS Earlie Lou L, MD   250 mg at 07/22/22 2107   And   primidone (MYSOLINE) tablet 125 mg  125 mg Oral Daily Earlie Lou L, MD   125 mg at 07/23/22 1013   spironolactone (ALDACTONE) tablet 25 mg  25 mg Oral Daily Earlie Lou L, MD   25 mg at 07/22/22 1023   sucralfate (CARAFATE) 1 GM/10ML suspension 1 g  1 g Oral BID Brahmbhatt, Parag, MD   1 g at 07/23/22 1012     Discharge Medications: Please see discharge summary for a list of discharge medications.  Relevant Imaging Results:  Relevant Lab Results:   Additional Information SS # [id number]  Beckie Busing, RN

## 2022-07-23 NOTE — Discharge Summary (Signed)
Physician Discharge Summary  Erica Griffin:[id number] DOB: [birth year] DOA: 07/20/2022  PCP: Erica Dupes, MD  Admit date: 07/20/2022 Discharge date: 07/23/2022  Admitted From: SNF Disposition:  SNF  Discharge Condition:Stable CODE STATUS:FULL Diet recommendation:  Dysphagia 3  Brief/Interim Summary: Patient is a 72 year old female with history of intellectual disability, allergic rhinitis, dysphagia, history of esophageal stricture with dilatation who presents to the emergency department from [facility] with complaint of dysphagia.  Patient reported persistent nausea, spitting for 2 days.  History of pill esophagitis, follows with [facility].  Reported sensation of foreign body in her throat. GI consulted, underwent EGD .  Aspiration pneumonia  also suspected, started on Unasyn.  Hospital course remarkable for requirement of 1 to 2 L of oxygen, now weaned off.  Plan for DC back to SNF today  Following problems were addressed during the hospitalization:  Dysphagia: Has history of chronic dysphagia.  Follows with [facility] as an outpatient, history of dilation in the past.  Presented with complaint of nausea, spitting and with sensation of foreign body in the throat. GI consulted.   EGD showed  distal esophageal ulceration as well as lower esophageal ring. Nonobstructing.. No food bolus. GI recommended Protonix 40 mg twice a day for at least next 2 to 3 months,Sucralfate 1 g twice a day for 4 weeks Plan for  repeat EGD with possible dilation of lower esophageal stricture once ulcers are healed.Recommended repeat EGD in 3 months . Speech therapist recommended dysphagia 3 diet   Acute hypoxic respiratory failure/possible aspiration pneumonia: Started on Augmentin,prednisone.  Has been weaned to room air now. Continue steroids for 4 more days, continue bronchodilators at the nursing facility   Hypertension:stable, continue current medications.  On spironolactone, Tenoretic   Hypokalemia:  Supplemented and corrected   Hyperlipidemia: On Lipitor   GERD: Continue PPI   Seizure disorder: Continue home medication.  On Lamictal   Intellectual disability/debility: Patient is able to communicate.  She lives at nursing facility, ambulates with the  help of wheelchair.  TOC consulted.    Right foot pain: X-ray of the right foot did not show  any fracture or dislocation.   Morbid  obesity: BMI 47.3     Discharge Diagnoses:  Principal Problem:   Dysphagia Active Problems:   Seizure disorder [facility])    Discharge Instructions  Discharge Instructions     Diet - low sodium heart healthy   Complete by: As directed    Discharge instructions   Complete by: As directed    1)Please take prescribed medications as instructed 2)Follow up with your PCP in a week   Increase activity slowly   Complete by: As directed       Allergies as of 07/23/2022       Reactions   Codeine Nausea And Vomiting        Medication List     TAKE these medications    A+D Prevent Oint ointment Apply 1 Application topically in the morning and at bedtime. Apply A+D ointment in between buttocks twice a day   acetaminophen 325 MG tablet Commonly known as: TYLENOL Take 650 mg by mouth 2 (two) times daily.   acetaminophen 500 MG tablet Commonly known as: TYLENOL Take 1,000 mg by mouth every 8 (eight) hours as needed for mild pain.   albuterol 108 (90 Base) MCG/ACT inhaler Commonly known as: VENTOLIN HFA Inhale 2 puffs into the lungs 2 (two) times daily.   amoxicillin-clavulanate 875-125 MG tablet Commonly known as:  AUGMENTIN Take 1 tablet by mouth every 12 (twelve) hours for 3 days.   atorvastatin 10 MG tablet Commonly known as: LIPITOR Take 10 mg by mouth at bedtime.   azelastine 0.1 % nasal spray Commonly known as: ASTELIN Place 1 spray into both nostrils 2 (two) times daily.   busPIRone 10 MG tablet Commonly known as: BUSPAR Take 10 mg by mouth 2 (two) times daily.    diphenhydrAMINE 12.5 MG chewable tablet Commonly known as: BENADRYL Chew 12.5 mg by mouth at bedtime.   doxepin 10 MG capsule Commonly known as: SINEQUAN Take 10 mg by mouth at bedtime.   escitalopram 10 MG tablet Commonly known as: LEXAPRO Take 10 mg by mouth in the morning.   fluticasone 50 MCG/ACT nasal spray Commonly known as: FLONASE Place 1 spray into both nostrils daily.   guaiFENesin 600 MG 12 hr tablet Commonly known as: MUCINEX Take 600 mg by mouth 2 (two) times daily.   ipratropium-albuterol 0.5-2.5 (3) MG/3ML Soln Commonly known as: DUONEB Take 3 mLs by nebulization every 6 (six) hours.   lamoTRIgine 100 MG tablet Commonly known as: LAMICTAL Take 100 mg by mouth 2 (two) times daily.   latanoprost 0.005 % ophthalmic solution Commonly known as: XALATAN Place 2 drops into both eyes at bedtime.   lidocaine 5 % Commonly known as: LIDODERM Place 1 patch onto the skin daily. Remove & Discard patch within 12 hours or as directed by MD  TO BILATERAL THIGH   loperamide 2 MG tablet Commonly known as: IMODIUM A-D Take 2 mg by mouth 2 (two) times daily as needed for diarrhea or loose stools.   Lumigan 0.01 % Soln Generic drug: bimatoprost Place 1 drop into both eyes daily.   Melatonin 10 MG Tabs Take 10 mg by mouth at bedtime.   Minerin Creme Crea Apply 1 Application topically daily. Apply Minerin Cream to bilateral lower extremities daily for dry cracked feet.   montelukast 10 MG tablet Commonly known as: SINGULAIR Take 10 mg by mouth at bedtime.   nystatin ointment Commonly known as: MYCOSTATIN Apply 1 Application topically 2 (two) times daily.   oxybutynin 5 MG 24 hr tablet Commonly known as: DITROPAN-XL Take 5 mg by mouth daily.   pantoprazole 40 MG tablet Commonly known as: PROTONIX Take 1 tablet (40 mg total) by mouth 2 (two) times daily.   polyvinyl alcohol 1.4 % ophthalmic solution Commonly known as: LIQUIFILM TEARS Place 1 drop into both  eyes 4 (four) times daily.   potassium chloride SA 20 MEQ tablet Commonly known as: KLOR-CON M Take 2 tablets (40 mEq total) by mouth daily for 3 days.   predniSONE 20 MG tablet Commonly known as: DELTASONE Take 2 tablets (40 mg total) by mouth daily with breakfast for 4 days. Start taking on: Jul 24, 2022   primidone 250 MG tablet Commonly known as: MYSOLINE Take 125 mg by mouth 3 (three) times daily.   spironolactone 25 MG tablet Commonly known as: ALDACTONE Take 25 mg by mouth daily.   timolol 0.5 % ophthalmic solution Commonly known as: TIMOPTIC Place 2 drops into both eyes daily.   Vitamin D3 1.25 MG (50000 UT) Tabs Take 1.25 mg by mouth See admin instructions. Every 14 days (2 weeks) on Monday   Voltaren Arthritis Pain 1 % Gel Generic drug: diclofenac Sodium Apply 3 g topically in the morning, at noon, and at bedtime. Apply to knees        Follow-up Information  Erica Dupes, MD. Schedule an appointment as soon as possible for a visit in 1 week(s).   Specialty: Family Medicine Contact information: [address] [place] [address] [phone]                Allergies  Allergen Reactions   Codeine Nausea And Vomiting    Consultations: GI   Procedures/Studies: DG Foot 2 Views Right  Result Date: 07/23/2022 CLINICAL DATA:  Right foot pain after injury several days ago. EXAM: RIGHT FOOT - 2 VIEW COMPARISON:  None Available. FINDINGS: There is no evidence of acute fracture or dislocation. Vascular calcifications are noted. Probable congenitally short first metatarsal is noted. Some deformity of this bone is noted suggesting old healed fracture. Moderate degenerative changes seen involving the first metatarsophalangeal joint. IMPRESSION: No acute abnormality seen. Electronically Signed   By: Lupita Raider M.D.   On: 07/23/2022 09:17   DG CHEST PORT 1 VIEW  Result Date: 07/21/2022 CLINICAL DATA:  Shortness of breath. EXAM: PORTABLE CHEST 1  VIEW COMPARISON:  Jul 20, 2022. FINDINGS: Stable cardiomediastinal silhouette. Lungs are clear. Bony thorax is unremarkable. Hypoinflation of the lungs is noted. IMPRESSION: Hypoinflation of the lungs. No acute cardiopulmonary abnormality seen. Electronically Signed   By: Lupita Raider M.D.   On: 07/21/2022 13:28   DG Chest Port 1 View  Result Date: 07/20/2022 CLINICAL DATA:  Two day history of pill stuck in the throat EXAM: PORTABLE CHEST 1 VIEW COMPARISON:  Chest radiograph dated 06/20/2020 FINDINGS: Low lung volumes. Bibasilar patchy opacities. No pleural effusion or pneumothorax. The heart size and mediastinal contours are within normal limits. No acute osseous abnormality. No radiopaque foreign body. IMPRESSION: 1. No radiopaque foreign body. 2. Low lung volumes with bibasilar patchy opacities, likely atelectasis. Electronically Signed   By: Agustin Cree M.D.   On: 07/20/2022 19:23      Subjective: Patient seen and examined at bedside today.  Hemodynamically stable.  She has been weaned to room air today.  Having some cough and wheezing but denies any worsening.  On room air today.  Medically stable for discharge  Discharge Exam: Vitals:   07/23/22 0118 07/23/22 0600  BP:  133/64  Pulse:  79  Resp:  18  Temp:  98.4 F (36.9 C)  SpO2: 93% 91%   Vitals:   07/22/22 2030 07/22/22 2048 07/23/22 0118 07/23/22 0600  BP: (!) 132/58   133/64  Pulse: 67   79  Resp: 18   18  Temp: 97.8 F (36.6 C)   98.4 F (36.9 C)  TempSrc: Oral   Oral  SpO2: 94% 92% 93% 91%  Weight:      Height:        General: Pt is alert, awake, not in acute distress,obese Cardiovascular: RRR, S1/S2 +, no rubs, no gallops Respiratory: Mild bilateral expiratory wheezing Abdominal: Soft, NT, ND, bowel sounds + Extremities: no edema, no cyanosis    The results of significant diagnostics from this hospitalization (including imaging, microbiology, ancillary and laboratory) are listed below for reference.      Microbiology: No results found for this or any previous visit (from the past 240 hour(s)).   Labs: BNP (last 3 results) No results for input(s): "BNP" in the last 8760 hours. Basic Metabolic Panel: Recent Labs  Lab 07/20/22 2046 07/21/22 0616 07/22/22 0623 07/23/22 0605  NA 137 133* 136 136  K 3.6 3.4* 3.5 3.5  CL 101 99 100 97*  CO2 27 24 31  31  GLUCOSE 103* 101* 105* 107*  BUN 11 9 7* 7*  CREATININE 0.84 0.74 0.75 0.86  CALCIUM 8.4* 8.1* 8.4* 8.7*   Liver Function Tests: Recent Labs  Lab 07/20/22 2046 07/21/22 0616  AST 19 19  ALT 17 16  ALKPHOS 122 104  BILITOT 0.6 0.6  PROT 7.4 7.0  ALBUMIN 3.6 3.4*   Recent Labs  Lab 07/20/22 2046  LIPASE 26   No results for input(s): "AMMONIA" in the last 168 hours. CBC: Recent Labs  Lab 07/20/22 1933 07/21/22 0616  WBC 8.0 7.0  NEUTROABS 5.4  --   HGB 13.1 12.2  HCT 40.4 38.9  MCV 96.0 100.3*  PLT 289 247   Cardiac Enzymes: No results for input(s): "CKTOTAL", "CKMB", "CKMBINDEX", "TROPONINI" in the last 168 hours. BNP: Invalid input(s): "POCBNP" CBG: No results for input(s): "GLUCAP" in the last 168 hours. D-Dimer No results for input(s): "DDIMER" in the last 72 hours. Hgb A1c No results for input(s): "HGBA1C" in the last 72 hours. Lipid Profile No results for input(s): "CHOL", "HDL", "LDLCALC", "TRIG", "CHOLHDL", "LDLDIRECT" in the last 72 hours. Thyroid function studies No results for input(s): "TSH", "T4TOTAL", "T3FREE", "THYROIDAB" in the last 72 hours.  Invalid input(s): "FREET3" Anemia work up No results for input(s): "VITAMINB12", "FOLATE", "FERRITIN", "TIBC", "IRON", "RETICCTPCT" in the last 72 hours. Urinalysis    Component Value Date/Time   COLORURINE YELLOW 07/20/2022 1907   APPEARANCEUR CLEAR 07/20/2022 1907   LABSPEC 1.013 07/20/2022 1907   PHURINE 7.0 07/20/2022 1907   GLUCOSEU NEGATIVE 07/20/2022 1907   HGBUR NEGATIVE 07/20/2022 1907   BILIRUBINUR NEGATIVE 07/20/2022 1907    KETONESUR 5 (A) 07/20/2022 1907   PROTEINUR NEGATIVE 07/20/2022 1907   NITRITE NEGATIVE 07/20/2022 1907   LEUKOCYTESUR TRACE (A) 07/20/2022 1907   Sepsis Labs Recent Labs  Lab 07/20/22 1933 07/21/22 0616  WBC 8.0 7.0   Microbiology No results found for this or any previous visit (from the past 240 hour(s)).  Please note: You were cared for by a hospitalist during your hospital stay. Once you are discharged, your primary care physician will handle any further medical issues. Please note that NO REFILLS for any discharge medications will be authorized once you are discharged, as it is imperative that you return to your primary care physician (or establish a relationship with a primary care physician if you do not have one) for your post hospital discharge needs so that they can reassess your need for medications and monitor your lab values.    Time coordinating discharge: 40 minutes  SIGNED:   Burnadette Pop, MD  [facility] 07/23/2022, 10:42 AM Pager [phone]  If 7PM-7AM, please contact night-coverage [email or web address] Password [email or web address]

## 2022-07-23 NOTE — Progress Notes (Signed)
Patient discharged to [facility] via [facility], attempted to call report with no answer on her hall. Will attempt to call again.

## 2022-07-27 ENCOUNTER — Emergency Department (HOSPITAL_COMMUNITY): Payer: Medicare Other

## 2022-07-27 ENCOUNTER — Emergency Department (HOSPITAL_COMMUNITY)
Admission: EM | Admit: 2022-07-27 | Discharge: 2022-07-27 | Disposition: A | Discharge status: Home / Self Care | Payer: Medicare Other | Attending: Emergency Medicine | Admitting: Emergency Medicine

## 2022-07-27 DIAGNOSIS — W449XXA Unspecified foreign body entering into or through a natural orifice, initial encounter: Secondary | ICD-10-CM | POA: Diagnosis not present

## 2022-07-27 DIAGNOSIS — T17908A Unspecified foreign body in respiratory tract, part unspecified causing other injury, initial encounter: Secondary | ICD-10-CM | POA: Diagnosis not present

## 2022-07-27 DIAGNOSIS — R1310 Dysphagia, unspecified: Secondary | ICD-10-CM | POA: Diagnosis present

## 2022-07-27 LAB — CBC WITH DIFFERENTIAL/PLATELET
Abs Immature Granulocytes: 0.14 10*3/uL — ABNORMAL HIGH (ref 0.00–0.07)
Basophils Absolute: 0 10*3/uL (ref 0.0–0.1)
Basophils Relative: 0 %
Eosinophils Absolute: 0 10*3/uL (ref 0.0–0.5)
Eosinophils Relative: 0 %
HCT: 41.8 % (ref 36.0–46.0)
Hemoglobin: 13.7 g/dL (ref 12.0–15.0)
Immature Granulocytes: 1 %
Lymphocytes Relative: 12 %
Lymphs Abs: 1.2 10*3/uL (ref 0.7–4.0)
MCH: 31.2 pg (ref 26.0–34.0)
MCHC: 32.8 g/dL (ref 30.0–36.0)
MCV: 95.2 fL (ref 80.0–100.0)
Monocytes Absolute: 0.3 10*3/uL (ref 0.1–1.0)
Monocytes Relative: 3 %
Neutro Abs: 8.7 10*3/uL — ABNORMAL HIGH (ref 1.7–7.7)
Neutrophils Relative %: 84 %
Platelets: 327 10*3/uL (ref 150–400)
RBC: 4.39 MIL/uL (ref 3.87–5.11)
RDW: 14.6 % (ref 11.5–15.5)
WBC: 10.4 10*3/uL (ref 4.0–10.5)
nRBC: 0 % (ref 0.0–0.2)

## 2022-07-27 LAB — POTASSIUM: Potassium: 4.6 mmol/L (ref 3.5–5.1)

## 2022-07-27 LAB — COMPREHENSIVE METABOLIC PANEL
ALT: 21 U/L (ref 0–44)
AST: 31 U/L (ref 15–41)
Albumin: 3.7 g/dL (ref 3.5–5.0)
Alkaline Phosphatase: 120 U/L (ref 38–126)
Anion gap: 11 (ref 5–15)
BUN: 11 mg/dL (ref 8–23)
CO2: 23 mmol/L (ref 22–32)
Calcium: 8.6 mg/dL — ABNORMAL LOW (ref 8.9–10.3)
Chloride: 101 mmol/L (ref 98–111)
Creatinine, Ser: 0.91 mg/dL (ref 0.44–1.00)
GFR, Estimated: >60 mL/min (ref >60)
Glucose, Bld: 123 mg/dL — ABNORMAL HIGH (ref 70–99)
Potassium: 5.4 mmol/L — ABNORMAL HIGH (ref 3.5–5.1)
Sodium: 135 mmol/L (ref 135–145)
Total Bilirubin: 1.1 mg/dL (ref 0.3–1.2)
Total Protein: 8 g/dL (ref 6.5–8.1)

## 2022-07-27 NOTE — ED Provider Notes (Signed)
[place] EMERGENCY DEPARTMENT AT [facility] Provider Note   CSN: [id number] Arrival date & time: 07/27/22  1405     History  Chief Complaint  Patient presents with   Esophageal obstruction    Erica Griffin is a 72 y.o. female.  HPI   72 year old female with history of intellectual disability, allergic rhinitis, dysphagia, history of esophageal stricture with dilatation Zentz to the emergency department after reported and witnessed aspiration event.  The patient has a history of pill esophagitis and follows outpatient with [facility].  She was recently hospitalized from 5/6 to 07/23/2022 after an aspiration event with treatment for aspiration pneumonia.  She also underwent EGD and was started on Protonix and Carafate bid will plan for repeat EGD in 3 months.  EGD notes, 2 superficial esophageal ulcers were noted to be found with a low-grade narrowing/the ring and a small hiatal hernia was present.  Per [facility]: One week ago, underwent EGD. While eating lunch today she choked on noodles and her drink. Concern for aspiration event today. Previously had been tolerating oral intake.   On arrival, the patient was unable to provide much more of a history.  She denied any complaints.  Home Medications Prior to Admission medications   Medication Sig Start Date End Date Taking? Authorizing Provider  acetaminophen (TYLENOL) 325 MG tablet Take 650 mg by mouth 2 (two) times daily.    [provider]  acetaminophen (TYLENOL) 500 MG tablet Take 1,000 mg by mouth every 8 (eight) hours as needed for mild pain.    [provider]  albuterol (VENTOLIN HFA) 108 (90 Base) MCG/ACT inhaler Inhale 2 puffs into the lungs 2 (two) times daily. 11/19/21   Luciano Cutter, MD  atorvastatin (LIPITOR) 10 MG tablet Take 10 mg by mouth at bedtime. 04/22/22   [provider]  azelastine (ASTELIN) 0.1 % nasal spray Place 1 spray into both nostrils 2 (two) times daily. 03/05/20    [provider]  busPIRone (BUSPAR) 10 MG tablet Take 10 mg by mouth 2 (two) times daily. 04/02/20   [provider]  Cholecalciferol (VITAMIN D3) 1.25 MG (50000 UT) TABS Take 1.25 mg by mouth See admin instructions. Every 14 days (2 weeks) on Monday    [provider]  diclofenac Sodium (VOLTAREN ARTHRITIS PAIN) 1 % GEL Apply 3 g topically in the morning, at noon, and at bedtime. Apply to knees    [provider]  diphenhydrAMINE (BENADRYL) 12.5 MG chewable tablet Chew 12.5 mg by mouth at bedtime.    [provider]  doxepin (SINEQUAN) 10 MG capsule Take 10 mg by mouth at bedtime. 05/01/22   [provider]  escitalopram (LEXAPRO) 10 MG tablet Take 10 mg by mouth in the morning. 12/18/18   [provider]  fluticasone (FLONASE) 50 MCG/ACT nasal spray Place 1 spray into both nostrils daily. 11/06/13   [provider]  guaiFENesin (MUCINEX) 600 MG 12 hr tablet Take 600 mg by mouth 2 (two) times daily.    [provider]  ipratropium-albuterol (DUONEB) 0.5-2.5 (3) MG/3ML SOLN Take 3 mLs by nebulization every 6 (six) hours. 07/23/22   Burnadette Pop, MD  lamoTRIgine (LAMICTAL) 100 MG tablet Take 100 mg by mouth 2 (two) times daily.    [provider]  latanoprost (XALATAN) 0.005 % ophthalmic solution Place 2 drops into both eyes at bedtime.    [provider]  lidocaine (LIDODERM) 5 % Place 1 patch onto the  skin daily. Remove & Discard patch within 12 hours or as directed by MD  TO BILATERAL THIGH    [provider]  loperamide (IMODIUM A-D) 2 MG tablet Take 2 mg by mouth 2 (two) times daily as needed for diarrhea or loose stools.    [provider]  LUMIGAN 0.01 % SOLN Place 1 drop into both eyes daily. 04/04/20   [provider]  Melatonin 10 MG TABS Take 10 mg by mouth at bedtime.    [provider]  montelukast (SINGULAIR) 10 MG tablet Take 10 mg by mouth at bedtime.  04/04/20   [provider]  nystatin ointment (MYCOSTATIN) Apply 1 Application topically 2 (two) times daily. 04/24/22   [provider]  oxybutynin (DITROPAN-XL) 5 MG 24 hr tablet Take 5 mg by mouth daily. 04/16/20   [provider]  pantoprazole (PROTONIX) 40 MG tablet Take 1 tablet (40 mg total) by mouth 2 (two) times daily. 05/22/22   Luciano Cutter, MD  polyvinyl alcohol (LIQUIFILM TEARS) 1.4 % ophthalmic solution Place 1 drop into both eyes 4 (four) times daily.    [provider]  potassium chloride SA (KLOR-CON M) 20 MEQ tablet Take 2 tablets (40 mEq total) by mouth daily for 3 days. 07/23/22 07/26/22  Burnadette Pop, MD  predniSONE (DELTASONE) 20 MG tablet Take 2 tablets (40 mg total) by mouth daily with breakfast for 4 days. 07/24/22 07/28/22  Burnadette Pop, MD  primidone (MYSOLINE) 250 MG tablet Take 125 mg by mouth 3 (three) times daily. 04/08/20   [provider]  Skin Protectants, Misc. (MINERIN CREME) CREA Apply 1 Application topically daily. Apply Minerin Cream to bilateral lower extremities daily for dry cracked feet.    [provider]  spironolactone (ALDACTONE) 25 MG tablet Take 25 mg by mouth daily. 05/21/22   [provider]  sucralfate (CARAFATE) 1 GM/10ML suspension Take 10 mLs (1 g total) by mouth 2 (two) times daily for 28 days. 07/23/22 08/20/22  Burnadette Pop, MD  timolol (TIMOPTIC) 0.5 % ophthalmic solution Place 2 drops into both eyes daily.    [provider]  Vitamins A & D (A+D PREVENT) OINT ointment Apply 1 Application topically in the morning and at bedtime. Apply A+D ointment in between buttocks twice a day    [provider]      Allergies    Codeine    Review of Systems   Review of Systems  Unable to perform ROS: Other    Physical Exam Updated Vital Signs BP (!) 152/90 (BP Location: Right Arm)   Pulse 84   Temp 97.6 F (36.4 C) (Oral)   Resp 18   SpO2 92%  Physical Exam Vitals  and nursing note reviewed.  Constitutional:      General: She is not in acute distress. HENT:     Head: Normocephalic and atraumatic.  Eyes:     Conjunctiva/sclera: Conjunctivae normal.     Pupils: Pupils are equal, round, and reactive to light.  Cardiovascular:     Rate and Rhythm: Normal rate and regular rhythm.  Pulmonary:     Effort: Pulmonary effort is normal. No respiratory distress.     Breath sounds: Normal breath sounds. No stridor. No wheezing, rhonchi or rales.  Abdominal:     General: There is no distension.     Tenderness: There is no guarding.  Musculoskeletal:        General: No deformity or signs of injury.  Cervical back: Neck supple.  Skin:    Findings: No lesion or rash.  Neurological:     General: No focal deficit present.     Mental Status: She is alert. Mental status is at baseline.     ED Results / Procedures / Treatments   Labs (all labs ordered are listed, but only abnormal results are displayed) Labs Reviewed  COMPREHENSIVE METABOLIC PANEL - Abnormal; Notable for the following components:      Result Value   Potassium 5.4 (*)    Glucose, Bld 123 (*)    Calcium 8.6 (*)    All other components within normal limits  CBC WITH DIFFERENTIAL/PLATELET - Abnormal; Notable for the following components:   Neutro Abs 8.7 (*)    Abs Immature Granulocytes 0.14 (*)    All other components within normal limits  CBC WITH DIFFERENTIAL/PLATELET  POTASSIUM    EKG None  Radiology DG Chest 2 View  Result Date: 07/27/2022 CLINICAL DATA:  Question aspiration, esophageal obstruction. Dysphagia. EXAM: CHEST - 2 VIEW COMPARISON:  07/21/2022. FINDINGS: Trachea is midline. Heart is enlarged, as before. Lungs are low in volume with mild bibasilar mixed interstitial and airspace opacification. No definite pleural fluid. IMPRESSION: Low lung volumes with mild bibasilar mixed interstitial and airspace opacification which may be due to atelectasis. Difficult to exclude  aspiration. Electronically Signed   By: Leanna Battles M.D.   On: 07/27/2022 15:41    Procedures Procedures    Medications Ordered in ED Medications - No data to display  ED Course/ Medical Decision Making/ A&P                             Medical Decision Making Amount and/or Complexity of Data Reviewed Labs: ordered. Radiology: ordered.    72 year old female with history of intellectual disability, allergic rhinitis, dysphagia, history of esophageal stricture with dilatation Zentz to the emergency department after reported and witnessed aspiration event.  The patient has a history of pill esophagitis and follows outpatient with [facility].  She was recently hospitalized from 5/6 to 07/23/2022 after an aspiration event with treatment for aspiration pneumonia.  She also underwent EGD and was started on Protonix and Carafate bid will plan for repeat EGD in 3 months.  EGD notes, 2 superficial esophageal ulcers were noted to be found with a low-grade narrowing/the ring and a small hiatal hernia was present.  Per [facility]: One week ago, underwent EGD. While eating lunch today she choked on noodles and her drink. Concern for aspiration event today. Previously had been tolerating oral intake.   On arrival, the patient was unable to provide much more of a history.  She denied any complaints.  Ddx: Food bolus, esophogeal stricture, aspiration event.  CXR revealed: IMPRESSION:  Low lung volumes with mild bibasilar mixed interstitial and airspace  opacification which may be due to atelectasis. Difficult to exclude  aspiration.    Labs: CMP with K 5.4 with hemolysis, recheck pending.  Spoke with on-call gastroenterology, Dr Levora Angel: who did not think repeat EGD was indicated given recent EGD results. Plan at sign-out for likely discharge back to her facility, she is not hypoxic or in any distress. Plan to recheck her potassium and DC back to her facility.    Final Clinical  Impression(s) / ED Diagnoses Final diagnoses:  Aspiration into airway, initial encounter    Rx / DC Orders ED Discharge Orders     None  Ernie Avena, MD 07/27/22 1725

## 2022-07-27 NOTE — ED Notes (Signed)
[facility] called for transport.  Several ahead of this pt for txp

## 2022-07-27 NOTE — Discharge Instructions (Addendum)
Your vital signs were reassuring, O2 sats were normal on room air. Gastroenterology did not see an indication for a repeat EGD. Watch for signs of aspiration pneumonia which include worsening productive cough, respiratory distress, fever/chills.

## 2022-07-27 NOTE — ED Triage Notes (Signed)
Pt BIBA from [facility] c/o esophageal obstruction while she was eating soft food. A&O x4, dysphagia. Wheeze but hx of respiratory failure.  NP concerned of aspiration.   BP 150/90 HR 90 CBG 110 94% RA

## 2022-07-27 NOTE — ED Provider Notes (Signed)
Patient was initially seen by Dr. Karene Fry.  Please see his note.  Patient's initial potassium was elevated at 5.4 but there was hemolysis on the sample.  Repeat potassium level was drawn.  Repeat is 4.6.     Linwood Dibbles, MD 07/27/22 [phone]

## 2022-08-25 ENCOUNTER — Other Ambulatory Visit: Payer: Self-pay

## 2022-08-25 ENCOUNTER — Emergency Department (HOSPITAL_COMMUNITY): Payer: Medicare Other

## 2022-08-25 ENCOUNTER — Emergency Department (HOSPITAL_COMMUNITY)
Admission: EM | Admit: 2022-08-25 | Discharge: 2022-08-25 | Disposition: A | Discharge status: Skilled Nursing Facility | Payer: Medicare Other | Attending: Emergency Medicine | Admitting: Emergency Medicine

## 2022-08-25 ENCOUNTER — Encounter (HOSPITAL_COMMUNITY): Payer: Self-pay

## 2022-08-25 DIAGNOSIS — W44F3XA Food entering into or through a natural orifice, initial encounter: Secondary | ICD-10-CM | POA: Diagnosis not present

## 2022-08-25 DIAGNOSIS — T17320A Food in larynx causing asphyxiation, initial encounter: Secondary | ICD-10-CM | POA: Diagnosis present

## 2022-08-25 MED ORDER — ALBUTEROL SULFATE HFA 108 (90 BASE) MCG/ACT IN AERS
2.0000 | INHALATION_SPRAY | RESPIRATORY_TRACT | Status: DC | PRN
  Administered 2022-08-25: 2 via RESPIRATORY_TRACT
  Filled 2022-08-25: qty 6.7

## 2022-08-25 NOTE — ED Notes (Signed)
Pt given coke with verbal order from Dr. Anitra Lauth

## 2022-08-25 NOTE — Discharge Instructions (Addendum)
X-ray was clear, oxygen has been greater than 95% even with sleeping.  Erica Griffin was able to drink Coke without problems, no coughing with drinking.  She needs to make sure her food is cut into small pieces and then she chews well.  If she continues to have issues she should follow-up with gastroenterology that has seen her in the past.  Continue current medications.  If she starts having fever, shortness of breath she needs to be evaluated by facility physician or return to the emergency room.

## 2022-08-25 NOTE — ED Notes (Signed)
[facility] called for pt transport. 

## 2022-08-25 NOTE — ED Triage Notes (Signed)
BIB EMS from [facility] for an episode of choking when eating today. Upon EMS arriving, pt was sitting in her bed and having no difficulty breathing or swallowing at this time. Pt has been coughing intermittently since. Hx of esophageal strictures and has her esophagus stretched frequently. Facility wants pt cleared prior to her returning.

## 2022-08-25 NOTE — ED Provider Notes (Signed)
[place] EMERGENCY DEPARTMENT AT [facility] Provider Note   CSN: [id number] Arrival date & time: 08/25/22  1436     History  Chief Complaint  Patient presents with   Cough    Choked on food    Erica Griffin is a 72 y.o. female.  Pt is a 72y/o with intellectual disability, allergic rhinitis, dysphagia, history of esophageal stricture with dilatation with most recent EGD on 07/27/2022 showing a broad-based ulcer and a minimal stricture but no other acute findings who is presenting today from her skilled facility after she was witnessed choking on some ham.  Patient reports that she has to have her food cut and she tries to chew it a lot but she must not have chewed it well enough today and it felt like it got stuck.  She has had some coughing intermittently but denies feeling short of breath now.  She does use an inhaler and morning and night but has not had anything since the event.  She was sent here for evaluation.  She denies any abdominal pain, chest pain or throat pain.  The history is provided by the patient.  Cough      Home Medications Prior to Admission medications   Medication Sig Start Date End Date Taking? Authorizing Provider  acetaminophen (TYLENOL) 325 MG tablet Take 650 mg by mouth 2 (two) times daily.    [provider]  acetaminophen (TYLENOL) 500 MG tablet Take 1,000 mg by mouth every 8 (eight) hours as needed for mild pain.    [provider]  albuterol (VENTOLIN HFA) 108 (90 Base) MCG/ACT inhaler Inhale 2 puffs into the lungs 2 (two) times daily. 11/19/21   Luciano Cutter, MD  atorvastatin (LIPITOR) 10 MG tablet Take 10 mg by mouth at bedtime. 04/22/22   [provider]  azelastine (ASTELIN) 0.1 % nasal spray Place 1 spray into both nostrils 2 (two) times daily. 03/05/20   [provider]  busPIRone (BUSPAR) 10 MG tablet Take 10 mg by mouth 2 (two) times daily. 04/02/20   [provider]  Cholecalciferol  (VITAMIN D3) 1.25 MG (50000 UT) TABS Take 1.25 mg by mouth See admin instructions. Every 14 days (2 weeks) on Monday    [provider]  diclofenac Sodium (VOLTAREN ARTHRITIS PAIN) 1 % GEL Apply 3 g topically in the morning, at noon, and at bedtime. Apply to knees    [provider]  diphenhydrAMINE (BENADRYL) 12.5 MG chewable tablet Chew 12.5 mg by mouth at bedtime.    [provider]  doxepin (SINEQUAN) 10 MG capsule Take 10 mg by mouth at bedtime. 05/01/22   [provider]  escitalopram (LEXAPRO) 10 MG tablet Take 10 mg by mouth in the morning. 12/18/18   [provider]  fluticasone (FLONASE) 50 MCG/ACT nasal spray Place 1 spray into both nostrils daily. 11/06/13   [provider]  guaiFENesin (MUCINEX) 600 MG 12 hr tablet Take 600 mg by mouth 2 (two) times daily.    [provider]  ipratropium-albuterol (DUONEB) 0.5-2.5 (3) MG/3ML SOLN Take 3 mLs by nebulization every 6 (six) hours. 07/23/22   Burnadette Pop, MD  lamoTRIgine (LAMICTAL) 100 MG tablet Take 100 mg by mouth 2 (two) times daily.    [provider]  latanoprost (XALATAN) 0.005 % ophthalmic solution Place 2 drops into both eyes at bedtime.    [provider]  lidocaine (LIDODERM) 5 % Place 1 patch onto the skin daily. Remove &  Discard patch within 12 hours or as directed by MD  TO BILATERAL THIGH    [provider]  loperamide (IMODIUM A-D) 2 MG tablet Take 2 mg by mouth 2 (two) times daily as needed for diarrhea or loose stools.    [provider]  LUMIGAN 0.01 % SOLN Place 1 drop into both eyes daily. 04/04/20   [provider]  Melatonin 10 MG TABS Take 10 mg by mouth at bedtime.    [provider]  montelukast (SINGULAIR) 10 MG tablet Take 10 mg by mouth at bedtime. 04/04/20   [provider]  nystatin ointment (MYCOSTATIN) Apply 1 Application topically 2 (two) times daily. 04/24/22   [provider]   oxybutynin (DITROPAN-XL) 5 MG 24 hr tablet Take 5 mg by mouth daily. 04/16/20   [provider]  pantoprazole (PROTONIX) 40 MG tablet Take 1 tablet (40 mg total) by mouth 2 (two) times daily. 05/22/22   Luciano Cutter, MD  polyvinyl alcohol (LIQUIFILM TEARS) 1.4 % ophthalmic solution Place 1 drop into both eyes 4 (four) times daily.    [provider]  potassium chloride SA (KLOR-CON M) 20 MEQ tablet Take 2 tablets (40 mEq total) by mouth daily for 3 days. 07/23/22 08/25/22  Burnadette Pop, MD  primidone (MYSOLINE) 250 MG tablet Take 125 mg by mouth 3 (three) times daily. 04/08/20   [provider]  Skin Protectants, Misc. (MINERIN CREME) CREA Apply 1 Application topically daily. Apply Minerin Cream to bilateral lower extremities daily for dry cracked feet.    [provider]  spironolactone (ALDACTONE) 25 MG tablet Take 25 mg by mouth daily. 05/21/22   [provider]  sucralfate (CARAFATE) 1 GM/10ML suspension Take 10 mLs (1 g total) by mouth 2 (two) times daily for 28 days. 07/23/22 08/25/22  Burnadette Pop, MD  timolol (TIMOPTIC) 0.5 % ophthalmic solution Place 2 drops into both eyes daily.    [provider]  Vitamins A & D (A+D PREVENT) OINT ointment Apply 1 Application topically in the morning and at bedtime. Apply A+D ointment in between buttocks twice a day    [provider]      Allergies    Codeine    Review of Systems   Review of Systems  Respiratory:  Positive for cough.     Physical Exam Updated Vital Signs BP (!) 158/80   Pulse 80   Temp 98 F (36.7 C) (Oral)   Resp 18   Ht 5\' 6"  (1.676 m)   Wt 128.4 kg   SpO2 98%   BMI 45.68 kg/m  Physical Exam Vitals and nursing note reviewed.  Constitutional:      General: She is not in acute distress.    Appearance: She is well-developed.  HENT:     Head: Normocephalic and atraumatic.  Eyes:     Pupils: Pupils are equal, round, and reactive to light.  Neck:      Comments: No voice changes or anterior neck pain or swelling.  No drooling Cardiovascular:     Rate and Rhythm: Normal rate and regular rhythm.     Heart sounds: Normal heart sounds. No murmur heard.    No friction rub.  Pulmonary:     Effort: Pulmonary effort is normal.     Breath sounds: Wheezing present. No rales.     Comments: Occasional coughing on exam and scant expiratory wheeze Abdominal:     General: Bowel sounds are normal. There is no distension.  Palpations: Abdomen is soft.     Tenderness: There is no abdominal tenderness. There is no guarding or rebound.  Musculoskeletal:        General: No tenderness. Normal range of motion.     Comments: No edema  Skin:    General: Skin is warm and dry.     Findings: No rash.  Neurological:     Mental Status: She is alert and oriented to person, place, and time. Mental status is at baseline.     Cranial Nerves: No cranial nerve deficit.     Comments: Patient is extremely difficult to understand due to her speech but seems to comprehend normally.  No acute abnormalities noted  Psychiatric:        Behavior: Behavior normal.     ED Results / Procedures / Treatments   Labs (all labs ordered are listed, but only abnormal results are displayed) Labs Reviewed - No data to display  EKG None  Radiology DG Chest 2 View  Result Date: 08/25/2022 CLINICAL DATA:  Episode of choking, cough EXAM: CHEST - 2 VIEW COMPARISON:  Previous studies including the examination of 07/27/2022 FINDINGS: Transverse diameter of heart is increased. There are no signs of pulmonary edema or focal pulmonary consolidation. There is poor inspiration. There is no significant pleural effusion or pneumothorax. IMPRESSION: Poor inspiration. Cardiomegaly. There are no signs of pulmonary edema or focal pulmonary consolidation. Electronically Signed   By: Ernie Avena M.D.   On: 08/25/2022 17:08    Procedures Procedures    Medications Ordered in  ED Medications  albuterol (VENTOLIN HFA) 108 (90 Base) MCG/ACT inhaler 2 puff (2 puffs Inhalation Given 08/25/22 1537)    ED Course/ Medical Decision Making/ A&P                             Medical Decision Making Amount and/or Complexity of Data Reviewed Radiology: ordered and independent interpretation performed. Decision-making details documented in ED Course.  Risk Prescription drug management.   Pt with multiple medical problems and comorbidities and presenting today with a complaint that caries a high risk for morbidity and mortality.  Here today with complaint of a choking episode.  Patient has this recurrently has had to have dilation in the past but last had an EGD 1 month ago that showed ulcer but no significant strictures.  Sounds like patient was eating ham and most likely did not chew well enough.  She is coughing on occasion but sats are 97% and higher on room air.  A scant wheeze and will give albuterol for this.  Otherwise patient is not drooling low suspicion for food bolus impaction.  Patient was able to drink water here without difficulty.  I have independently visualized and interpreted pt's images today.  Chest x-ray without evidence of infiltrate or pneumothorax but patient did have a poor inspiratory film. Patient was able to drink here without any coughing with drinking and low suspicion for ongoing aspiration.  Possible for mild aspiration from her choking event but seems to have cleared the food and does not have a food bolus impaction.  Sats have been normal and wheezing resolved after albuterol inhaler.  At this time patient appears cleared for discharge.  Findings were discussed with the patient.  She was discharged back to her facility.          Final Clinical Impression(s) / ED Diagnoses Final diagnoses:  Choking due to food (regurgitated), initial encounter  Rx / DC Orders ED Discharge Orders     None         Gwyneth Sprout, MD 08/25/22  1739

## 2022-09-01 ENCOUNTER — Other Ambulatory Visit: Payer: Self-pay

## 2022-09-01 ENCOUNTER — Encounter (HOSPITAL_COMMUNITY): Payer: Self-pay

## 2022-09-01 ENCOUNTER — Emergency Department (HOSPITAL_COMMUNITY)
Admission: EM | Admit: 2022-09-01 | Discharge: 2022-09-01 | Disposition: A | Discharge status: Home / Self Care | Payer: Medicare Other | Attending: Emergency Medicine | Admitting: Emergency Medicine

## 2022-09-01 DIAGNOSIS — Z7951 Long term (current) use of inhaled steroids: Secondary | ICD-10-CM | POA: Insufficient documentation

## 2022-09-01 DIAGNOSIS — R569 Unspecified convulsions: Secondary | ICD-10-CM | POA: Diagnosis present

## 2022-09-01 DIAGNOSIS — J45909 Unspecified asthma, uncomplicated: Secondary | ICD-10-CM | POA: Diagnosis not present

## 2022-09-01 HISTORY — DX: Unspecified convulsions: R56.9

## 2022-09-01 LAB — CBC WITH DIFFERENTIAL/PLATELET
Abs Immature Granulocytes: 0.03 10*3/uL (ref 0.00–0.07)
Basophils Absolute: 0.1 10*3/uL (ref 0.0–0.1)
Basophils Relative: 1 %
Eosinophils Absolute: 0.3 10*3/uL (ref 0.0–0.5)
Eosinophils Relative: 4 %
HCT: 39.8 % (ref 36.0–46.0)
Hemoglobin: 12.8 g/dL (ref 12.0–15.0)
Immature Granulocytes: 0 %
Lymphocytes Relative: 23 %
Lymphs Abs: 2 10*3/uL (ref 0.7–4.0)
MCH: 31 pg (ref 26.0–34.0)
MCHC: 32.2 g/dL (ref 30.0–36.0)
MCV: 96.4 fL (ref 80.0–100.0)
Monocytes Absolute: 0.8 10*3/uL (ref 0.1–1.0)
Monocytes Relative: 9 %
Neutro Abs: 5.6 10*3/uL (ref 1.7–7.7)
Neutrophils Relative %: 63 %
Platelets: 322 10*3/uL (ref 150–400)
RBC: 4.13 MIL/uL (ref 3.87–5.11)
RDW: 14.8 % (ref 11.5–15.5)
WBC: 8.8 10*3/uL (ref 4.0–10.5)
nRBC: 0 % (ref 0.0–0.2)

## 2022-09-01 LAB — PHOSPHORUS: Phosphorus: 3.3 mg/dL (ref 2.5–4.6)

## 2022-09-01 LAB — COMPREHENSIVE METABOLIC PANEL
ALT: 17 U/L (ref 0–44)
AST: 19 U/L (ref 15–41)
Albumin: 3.6 g/dL (ref 3.5–5.0)
Alkaline Phosphatase: 117 U/L (ref 38–126)
Anion gap: 8 (ref 5–15)
BUN: 8 mg/dL (ref 8–23)
CO2: 27 mmol/L (ref 22–32)
Calcium: 8.7 mg/dL — ABNORMAL LOW (ref 8.9–10.3)
Chloride: 102 mmol/L (ref 98–111)
Creatinine, Ser: 0.78 mg/dL (ref 0.44–1.00)
GFR, Estimated: >60 mL/min (ref >60)
Glucose, Bld: 107 mg/dL — ABNORMAL HIGH (ref 70–99)
Potassium: 4.1 mmol/L (ref 3.5–5.1)
Sodium: 137 mmol/L (ref 135–145)
Total Bilirubin: 0.3 mg/dL (ref 0.3–1.2)
Total Protein: 7.2 g/dL (ref 6.5–8.1)

## 2022-09-01 LAB — MAGNESIUM: Magnesium: 2.1 mg/dL (ref 1.7–2.4)

## 2022-09-01 LAB — CBG MONITORING, ED: Glucose-Capillary: 109 mg/dL — ABNORMAL HIGH (ref 70–99)

## 2022-09-01 NOTE — ED Notes (Signed)
RN to room , introduced self to pt, pt updated on care plan ,pt requesting food, RN confirmed pt could eat, sandwich was provided, pt denies any other needs at this time, RR even and unlabored, pt on monitor, rails x2 , call light in reach

## 2022-09-01 NOTE — ED Provider Notes (Addendum)
[place] EMERGENCY DEPARTMENT AT [facility] Provider Note   CSN: [id number] Arrival date & time: 09/01/22  1549     History  Chief Complaint  Patient presents with   Seizures    Erica Griffin is a 72 y.o. female.  HPI    72 y/o F comes in with cc of seizures. Pt has hx of seizures - but nobody at the facility has ever witnessed a seizure.  According to the nurse at the [facility] and rehab facility, patient was sitting on her wheelchair and was found to be unresponsive and with a blank stare.  That episode lasted for about 5 minutes.  Per EMS, when they arrived patient was not postictal.  Patient takes Lamictal for seizure.  Patient has past medical history of cognitive communication deficit, seizures, asthma.  According to the nursing home, no recent illnesses and patient does not have history of recurrent infections.  Patient has no current complaints.  Specifically she denies any headache, chest pain, shortness of breath,.  Home Medications Prior to Admission medications   Medication Sig Start Date End Date Taking? Authorizing Provider  acetaminophen (TYLENOL) 325 MG tablet Take 650 mg by mouth 2 (two) times daily.    [provider]  acetaminophen (TYLENOL) 500 MG tablet Take 1,000 mg by mouth every 8 (eight) hours as needed for mild pain.    [provider]  albuterol (VENTOLIN HFA) 108 (90 Base) MCG/ACT inhaler Inhale 2 puffs into the lungs 2 (two) times daily. 11/19/21   Luciano Cutter, MD  atorvastatin (LIPITOR) 10 MG tablet Take 10 mg by mouth at bedtime. 04/22/22   [provider]  azelastine (ASTELIN) 0.1 % nasal spray Place 1 spray into both nostrils 2 (two) times daily. 03/05/20   [provider]  busPIRone (BUSPAR) 10 MG tablet Take 10 mg by mouth 2 (two) times daily. 04/02/20   [provider]  Cholecalciferol (VITAMIN D3) 1.25 MG (50000 UT) TABS Take 1.25 mg by mouth See admin instructions. Every 14 days  (2 weeks) on Monday    [provider]  diclofenac Sodium (VOLTAREN ARTHRITIS PAIN) 1 % GEL Apply 3 g topically in the morning, at noon, and at bedtime. Apply to knees    [provider]  diphenhydrAMINE (BENADRYL) 12.5 MG chewable tablet Chew 12.5 mg by mouth at bedtime.    [provider]  doxepin (SINEQUAN) 10 MG capsule Take 10 mg by mouth at bedtime. 05/01/22   [provider]  escitalopram (LEXAPRO) 10 MG tablet Take 10 mg by mouth in the morning. 12/18/18   [provider]  fluticasone (FLONASE) 50 MCG/ACT nasal spray Place 1 spray into both nostrils daily. 11/06/13   [provider]  guaiFENesin (MUCINEX) 600 MG 12 hr tablet Take 600 mg by mouth 2 (two) times daily.    [provider]  ipratropium-albuterol (DUONEB) 0.5-2.5 (3) MG/3ML SOLN Take 3 mLs by nebulization every 6 (six) hours. 07/23/22   Burnadette Pop, MD  lamoTRIgine (LAMICTAL) 100 MG tablet Take 100 mg by mouth 2 (two) times daily.    [provider]  latanoprost (XALATAN) 0.005 % ophthalmic solution Place 2 drops into both eyes at bedtime.    [provider]  lidocaine (LIDODERM) 5 % Place 1 patch onto the skin daily. Remove & Discard patch within 12 hours or as directed by MD  TO BILATERAL THIGH    [provider]  loperamide (IMODIUM A-D) 2 MG tablet Take  2 mg by mouth 2 (two) times daily as needed for diarrhea or loose stools.    [provider]  LUMIGAN 0.01 % SOLN Place 1 drop into both eyes daily. 04/04/20   [provider]  Melatonin 10 MG TABS Take 10 mg by mouth at bedtime.    [provider]  montelukast (SINGULAIR) 10 MG tablet Take 10 mg by mouth at bedtime. 04/04/20   [provider]  nystatin ointment (MYCOSTATIN) Apply 1 Application topically 2 (two) times daily. 04/24/22   [provider]  oxybutynin (DITROPAN-XL) 5 MG 24 hr tablet Take 5 mg by mouth daily. 04/16/20   [provider]  pantoprazole (PROTONIX) 40 MG tablet Take 1 tablet (40 mg total) by mouth 2 (two) times daily. 05/22/22   Luciano Cutter, MD  polyvinyl alcohol (LIQUIFILM TEARS) 1.4 % ophthalmic solution Place 1 drop into both eyes 4 (four) times daily.    [provider]  potassium chloride SA (KLOR-CON M) 20 MEQ tablet Take 2 tablets (40 mEq total) by mouth daily for 3 days. 07/23/22 08/25/22  Burnadette Pop, MD  primidone (MYSOLINE) 250 MG tablet Take 125 mg by mouth 3 (three) times daily. 04/08/20   [provider]  Skin Protectants, Misc. (MINERIN CREME) CREA Apply 1 Application topically daily. Apply Minerin Cream to bilateral lower extremities daily for dry cracked feet.    [provider]  spironolactone (ALDACTONE) 25 MG tablet Take 25 mg by mouth daily. 05/21/22   [provider]  sucralfate (CARAFATE) 1 GM/10ML suspension Take 10 mLs (1 g total) by mouth 2 (two) times daily for 28 days. 07/23/22 08/25/22  Burnadette Pop, MD  timolol (TIMOPTIC) 0.5 % ophthalmic solution Place 2 drops into both eyes daily.    [provider]  Vitamins A & D (A+D PREVENT) OINT ointment Apply 1 Application topically in the morning and at bedtime. Apply A+D ointment in between buttocks twice a day    [provider]      Allergies    Codeine    Review of Systems   Review of Systems  Physical Exam Updated Vital Signs BP 137/75   Pulse 65   Temp 97.8 F (36.6 C) (Oral)   Resp 17   Ht 5\' 6"  (1.676 m)   Wt 128.4 kg   SpO2 96%   BMI 45.68 kg/m  Physical Exam Vitals and nursing note reviewed.  Constitutional:      Appearance: She is well-developed.  HENT:     Head: Normocephalic and atraumatic.  Eyes:     Extraocular Movements: Extraocular movements intact.     Pupils: Pupils are equal, round, and reactive to light.  Cardiovascular:     Rate and Rhythm: Normal rate.  Pulmonary:     Effort: Pulmonary effort is normal.  Musculoskeletal:      Cervical back: Normal range of motion and neck supple.  Skin:    General: Skin is dry.  Neurological:     Mental Status: She is alert and oriented to person, place, and time.     Sensory: No sensory deficit.     Motor: No weakness.     ED Results / Procedures / Treatments   Labs (all labs ordered are listed, but only abnormal results are displayed) Labs Reviewed  COMPREHENSIVE METABOLIC PANEL - Abnormal; Notable for the following components:      Result Value   Glucose, Bld 107 (*)    Calcium 8.7 (*)  All other components within normal limits  CBG MONITORING, ED - Abnormal; Notable for the following components:   Glucose-Capillary 109 (*)    All other components within normal limits  CBC WITH DIFFERENTIAL/PLATELET  MAGNESIUM  PHOSPHORUS    EKG EKG Interpretation  Date/Time:  Tuesday September 01 2022 16:03:09 EDT Ventricular Rate:  66 PR Interval:  175 QRS Duration: 92 QT Interval:  438 QTC Calculation: 459 R Axis:   45 Text Interpretation: Sinus rhythm Low voltage, precordial leads No acute changes No significant change since last tracing Confirmed by Derwood Kaplan [id number]) on 09/01/2022 4:19:56 PM  Radiology No results found.  Procedures Procedures    Medications Ordered in ED Medications - No data to display  ED Course/ Medical Decision Making/ A&P                             Medical Decision Making Amount and/or Complexity of Data Reviewed Labs: ordered.   This patient presents to the ED with chief complaint(s) of seizure-like activity with pertinent past medical history of well-controlled seizure disorder, cognitive and communication delay.The complaint involves an extensive differential diagnosis and also carries with it a high risk of complications and morbidity.    The differential diagnosis includes  -Seizure disorder -Trauma -ICH -Electrolyte abnormality -Metabolic derangement -Stroke -Toxin induced seizures -Medication side  effects -Hypoxia -Hypoglycemia -Infection such as UTI or cellulitis  The initial plan is to get basic labs and start monitoring the patient closely.  Patient is currently AOx3 with no focal neurodeficits.   Additional history obtained: Additional history obtained from EMS  and nursing home/care facility. I also called and spoke with patient's relative, who is listed as emergency contact and given update on the results at 8:05 pm.  They are aware that patient will be discharged.   Independent labs interpretation:  The following labs were independently interpreted: CBC, metabolic profile, magnesium is normal.  Treatment and Reassessment: No repeat episode of unresponsiveness or seizures. I have independently interpreted cardiac monitoring, no evidence of concerning arrhythmia.   Final Clinical Impression(s) / ED Diagnoses Final diagnoses:  Seizure-like activity [facility])    Rx / DC Orders ED Discharge Orders     None         Derwood Kaplan, MD 09/01/22 2009    Derwood Kaplan, MD 09/01/22 2011

## 2022-09-01 NOTE — ED Triage Notes (Addendum)
Pt BIB [facility] from [facility] with reports of a sz that lasted approx 5 minutes. EMS reports pt was still sitting in wheel chair and pt did not have a postictal period. Pt takes lamictal for sz and has been compliant with medications. EMS report CBG of 234.

## 2022-09-01 NOTE — ED Notes (Signed)
Transport has arrived for pt, pt appears to be in NAD at this time, VSS at pt baseline at time of transport

## 2022-09-01 NOTE — Discharge Instructions (Addendum)
Erica Griffin has been observed for over 4 hours in the emergency room.  She did not have any episodes of seizure-like activity.  Her blood work is reassuring.  It is possible that she had an absence seizure.  It is unclear to [place], based on asking questions to family and nursing home what patient's baseline seizures are.  Given that she is on Lamictal for seizure disorder, I recommend that patient be seen by neurologist to see if any dose adjustment is needed.  Additionally, she will need close neurologic monitoring over the next 48 hours (please check every 2-4 hours) to ensure there is no repeat episodes.  If there are repeat episodes please bring her back to the emergency room immediately.

## 2022-09-10 ENCOUNTER — Encounter (HOSPITAL_COMMUNITY): Payer: Self-pay

## 2022-09-10 ENCOUNTER — Emergency Department (HOSPITAL_COMMUNITY): Payer: Medicare Other

## 2022-09-10 ENCOUNTER — Telehealth: Payer: Self-pay | Admitting: Neurology

## 2022-09-10 ENCOUNTER — Emergency Department (HOSPITAL_COMMUNITY)
Admission: EM | Admit: 2022-09-10 | Discharge: 2022-09-11 | Disposition: A | Discharge status: Home / Self Care | Payer: Medicare Other | Attending: Emergency Medicine | Admitting: Emergency Medicine

## 2022-09-10 DIAGNOSIS — R569 Unspecified convulsions: Secondary | ICD-10-CM

## 2022-09-10 DIAGNOSIS — G40909 Epilepsy, unspecified, not intractable, without status epilepticus: Secondary | ICD-10-CM | POA: Diagnosis not present

## 2022-09-10 LAB — CBG MONITORING, ED: Glucose-Capillary: 107 mg/dL — ABNORMAL HIGH (ref 70–99)

## 2022-09-10 MED ORDER — LAMOTRIGINE 25 MG PO TABS: 25.0000 mg | ORAL_TABLET | Freq: Two times a day (BID) | ORAL | 0 refills | Status: DC

## 2022-09-10 MED ORDER — LAMOTRIGINE 25 MG PO TABS
125.0000 mg | ORAL_TABLET | Freq: Once | ORAL | Status: AC
  Administered 2022-09-10: 125 mg via ORAL
  Filled 2022-09-10: qty 5

## 2022-09-10 MED ORDER — LEVETIRACETAM 500 MG PO TABS
1000.0000 mg | ORAL_TABLET | Freq: Once | ORAL | Status: AC
  Administered 2022-09-10: 1000 mg via ORAL
  Filled 2022-09-10: qty 2

## 2022-09-10 NOTE — Discharge Instructions (Signed)
Your head scan today was normal.  The neurologist recommended that we increase your Lamictal level to 125 mg twice a day.  So in addition to the 100 mg you are taking we will add 25.  Your appointment with Dr. Karel Jarvis is on July 3 at 1 PM.  They will be able to see your levels from today and advised if there is any further changes they need.

## 2022-09-10 NOTE — ED Triage Notes (Signed)
Hx of seizures. Came from [facility]. 3 seizure episodes with facility and 2 witnessed seizures by EMS. Reports focal seizures symptoms. Seen here on 6/18 for same. Takes Lamictal.

## 2022-09-10 NOTE — Telephone Encounter (Signed)
Shanda Bumps at [facility] is calling in to see if Dr. Karel Jarvis would be willing to see the pt before the scheduled appt of 09/30/2022.  Pt has had several seizures with the most current ones being 09/10/2022 at 1:30 and lasting for 6 mins and the other one on 6/26 at 1:22 and lasting 7 mins.  They would like to ask Dr. Karel Jarvis if she can see pt  sooner.

## 2022-09-10 NOTE — Telephone Encounter (Signed)
Ok for July 3, at 1pm

## 2022-09-10 NOTE — ED Provider Notes (Signed)
[place] EMERGENCY DEPARTMENT AT [facility] Provider Note   CSN: [id number] Arrival date & time: 09/10/22  1555     History  Chief Complaint  Patient presents with   Seizures    Erica Griffin is a 72 y.o. female.  Patient is a 72 year old female with a history of cognitive delay, seizures, esophageal stricture and dysphagia who is presenting today from her care facility after being noted to have 3 seizures today.  Patient does have a history of seizure disorder and takes Lamictal but speaking with the facility floor manager she reports today Erica Griffin has had 3 seizures.  She has had 9 in the last 2 weeks and prior to that had gone a long time without having seizures.  She reports her seizures lasted for 7 minutes then 6 minutes and then the last 1 was under 5 minutes.  She reports the patient becomes unresponsive stairs usually often to 1 direction today she noticed her staring to the left and will not answer.  She has not noticed any lipsmacking, unilateral trembling or tonic-clonic movements.  The patient after her third seizure they noticed her voice and speech were even more difficult to understand than her baseline.  She was seen 9 days ago for similar symptoms.  At that time all of her blood work was normal.  She has continued to be compliant with the Lamictal and was planning on seeing the neurologist on 09/30/2022 but called today and they have changed her appointment to 09/16/2022.  Facility reports no infectious symptoms, new medication changes or discontinuation.  Otherwise patient had been her normal self prior to the seizures today.  Currently patient just complains of being tired but has no complaints  The history is provided by the patient and the nursing home.  Seizures      Home Medications Prior to Admission medications   Medication Sig Start Date End Date Taking? Authorizing Provider  lamoTRIgine (LAMICTAL) 25 MG tablet Take 1 tablet (25 mg total) by mouth 2  (two) times daily. 09/10/22  Yes Gwyneth Sprout, MD  acetaminophen (TYLENOL) 325 MG tablet Take 650 mg by mouth 2 (two) times daily.    [provider]  acetaminophen (TYLENOL) 500 MG tablet Take 1,000 mg by mouth every 8 (eight) hours as needed for mild pain.    [provider]  albuterol (VENTOLIN HFA) 108 (90 Base) MCG/ACT inhaler Inhale 2 puffs into the lungs 2 (two) times daily. 11/19/21   Luciano Cutter, MD  atorvastatin (LIPITOR) 10 MG tablet Take 10 mg by mouth at bedtime. 04/22/22   [provider]  azelastine (ASTELIN) 0.1 % nasal spray Place 1 spray into both nostrils 2 (two) times daily. 03/05/20   [provider]  busPIRone (BUSPAR) 10 MG tablet Take 10 mg by mouth 2 (two) times daily. 04/02/20   [provider]  Cholecalciferol (VITAMIN D3) 1.25 MG (50000 UT) TABS Take 1.25 mg by mouth See admin instructions. Every 14 days (2 weeks) on Monday    [provider]  diclofenac Sodium (VOLTAREN ARTHRITIS PAIN) 1 % GEL Apply 3 g topically in the morning, at noon, and at bedtime. Apply to knees    [provider]  diphenhydrAMINE (BENADRYL) 12.5 MG chewable tablet Chew 12.5 mg by mouth at bedtime.    [provider]  doxepin (SINEQUAN) 10 MG capsule Take 10 mg by mouth at bedtime. 05/01/22   [provider]  escitalopram (LEXAPRO) 10 MG tablet Take  10 mg by mouth in the morning. 12/18/18   [provider]  fluticasone (FLONASE) 50 MCG/ACT nasal spray Place 1 spray into both nostrils daily. 11/06/13   [provider]  guaiFENesin (MUCINEX) 600 MG 12 hr tablet Take 600 mg by mouth 2 (two) times daily.    [provider]  ipratropium-albuterol (DUONEB) 0.5-2.5 (3) MG/3ML SOLN Take 3 mLs by nebulization every 6 (six) hours. 07/23/22   Burnadette Pop, MD  lamoTRIgine (LAMICTAL) 100 MG tablet Take 100 mg by mouth 2 (two) times daily.    [provider]  latanoprost (XALATAN) 0.005 %  ophthalmic solution Place 2 drops into both eyes at bedtime.    [provider]  lidocaine (LIDODERM) 5 % Place 1 patch onto the skin daily. Remove & Discard patch within 12 hours or as directed by MD  TO BILATERAL THIGH    [provider]  loperamide (IMODIUM A-D) 2 MG tablet Take 2 mg by mouth 2 (two) times daily as needed for diarrhea or loose stools.    [provider]  LUMIGAN 0.01 % SOLN Place 1 drop into both eyes daily. 04/04/20   [provider]  Melatonin 10 MG TABS Take 10 mg by mouth at bedtime.    [provider]  montelukast (SINGULAIR) 10 MG tablet Take 10 mg by mouth at bedtime. 04/04/20   [provider]  nystatin ointment (MYCOSTATIN) Apply 1 Application topically 2 (two) times daily. 04/24/22   [provider]  oxybutynin (DITROPAN-XL) 5 MG 24 hr tablet Take 5 mg by mouth daily. 04/16/20   [provider]  pantoprazole (PROTONIX) 40 MG tablet Take 1 tablet (40 mg total) by mouth 2 (two) times daily. 05/22/22   Luciano Cutter, MD  polyvinyl alcohol (LIQUIFILM TEARS) 1.4 % ophthalmic solution Place 1 drop into both eyes 4 (four) times daily.    [provider]  potassium chloride SA (KLOR-CON M) 20 MEQ tablet Take 2 tablets (40 mEq total) by mouth daily for 3 days. 07/23/22 08/25/22  Burnadette Pop, MD  primidone (MYSOLINE) 250 MG tablet Take 125 mg by mouth 3 (three) times daily. 04/08/20   [provider]  Skin Protectants, Misc. (MINERIN CREME) CREA Apply 1 Application topically daily. Apply Minerin Cream to bilateral lower extremities daily for dry cracked feet.    [provider]  spironolactone (ALDACTONE) 25 MG tablet Take 25 mg by mouth daily. 05/21/22   [provider]  sucralfate (CARAFATE) 1 GM/10ML suspension Take 10 mLs (1 g total) by mouth 2 (two) times daily for 28 days. 07/23/22 08/25/22  Burnadette Pop, MD  timolol (TIMOPTIC) 0.5 % ophthalmic solution Place 2 drops into  both eyes daily.    [provider]  Vitamins A & D (A+D PREVENT) OINT ointment Apply 1 Application topically in the morning and at bedtime. Apply A+D ointment in between buttocks twice a day    [provider]      Allergies    Codeine    Review of Systems   Review of Systems  Neurological:  Positive for seizures.    Physical Exam Updated Vital Signs BP (!) 141/83 (BP Location: Right Arm)   Pulse 77   Temp 97.7 F (36.5 C) (Oral)   Resp 20   Ht 5\' 6"  (1.676 m)   Wt 128 kg   SpO2 96%   BMI 45.55 kg/m  Physical Exam Vitals and nursing note reviewed.  Constitutional:      General:  She is not in acute distress.    Appearance: She is well-developed.  HENT:     Head: Normocephalic and atraumatic.  Eyes:     Pupils: Pupils are equal, round, and reactive to light.  Cardiovascular:     Rate and Rhythm: Normal rate and regular rhythm.     Heart sounds: Normal heart sounds. No murmur heard.    No friction rub.  Pulmonary:     Effort: Pulmonary effort is normal.     Breath sounds: Normal breath sounds. No wheezing or rales.  Abdominal:     General: Bowel sounds are normal. There is no distension.     Palpations: Abdomen is soft.     Tenderness: There is no abdominal tenderness. There is no guarding or rebound.  Musculoskeletal:        General: No tenderness. Normal range of motion.     Comments: No edema  Skin:    General: Skin is warm and dry.     Findings: No rash.  Neurological:     Mental Status: She is alert and oriented to person, place, and time. Mental status is at baseline.     Cranial Nerves: No cranial nerve deficit.     Sensory: No sensory deficit.     Motor: No weakness.     Comments: Patient's speech is difficult to understand but appears to be baseline.  She comprehends spoken speech without difficulty.  No focal findings.  Psychiatric:        Mood and Affect: Mood normal.        Behavior: Behavior normal.     ED Results / Procedures  / Treatments   Labs (all labs ordered are listed, but only abnormal results are displayed) Labs Reviewed  CBG MONITORING, ED - Abnormal; Notable for the following components:      Result Value   Glucose-Capillary 107 (*)    All other components within normal limits  LAMOTRIGINE LEVEL    EKG None  Radiology CT Head Wo Contrast  Result Date: 09/10/2022 CLINICAL DATA:  Seizures EXAM: CT HEAD WITHOUT CONTRAST TECHNIQUE: Contiguous axial images were obtained from the base of the skull through the vertex without intravenous contrast. RADIATION DOSE REDUCTION: This exam was performed according to the departmental dose-optimization program which includes automated exposure control, adjustment of the mA and/or kV according to patient size and/or use of iterative reconstruction technique. COMPARISON:  None Available. FINDINGS: Brain: No acute infarct or hemorrhage. Lateral ventricles and midline structures are unremarkable. No acute extra-axial fluid collections. No mass effect. Vascular: No hyperdense vessel or unexpected calcification. Skull: Normal. Negative for fracture or focal lesion. Sinuses/Orbits: No acute finding. Other: None. IMPRESSION: 1. No acute intracranial process. Electronically Signed   By: Sharlet Salina M.D.   On: 09/10/2022 18:39    Procedures Procedures    Medications Ordered in ED Medications  lamoTRIgine (LAMICTAL) tablet 125 mg (has no administration in time range)  levETIRAcetam (KEPPRA) tablet 1,000 mg (1,000 mg Oral Given 09/10/22 2053)    ED Course/ Medical Decision Making/ A&P                             Medical Decision Making Amount and/or Complexity of Data Reviewed Labs: ordered. Radiology: ordered and independent interpretation performed. Decision-making details documented in ED Course.  Risk Prescription drug management.   Pt with multiple medical problems and comorbidities and presenting today with a complaint that caries a high risk  for morbidity  and mortality.   Here today with seizures.  Patient does have a history of seizures but in the last 2 weeks has had 9.  This is despite taking her Lamictal 100 mg twice daily.  She does not appear to have any infectious etiology and had lab work done 9 days ago which showed no electrolyte abnormalities, renal issues.  Patient is not displaying any symptoms concerning for stroke at this time.  Will do a head CT to ensure no acute findings that would suggest why she has had seizures recently.  Spoke with patient's nursing facility and they gave the report about the seizures.  They confirm that her medications have been unchanged and she was otherwise at her baseline best for these episodes today.  9:03 PM I have independently visualized and interpreted pt's images today.  Head CT without acute findings.  Radiology reports no acute findings.  Will discuss with neurology  9:03 PM Spoke with Dr. Otelia Limes who recommended increasing the Lamictal to 125 mg twice daily.  Also recommended giving 1000 mg of Keppra here to hopefully prevent further seizures.  Lamictal level was sent so that [facility] neurology will have those results for her appointment on July 3.  At this time patient appears stable for discharge.         Final Clinical Impression(s) / ED Diagnoses Final diagnoses:  Seizure [facility])    Rx / DC Orders ED Discharge Orders          Ordered    lamoTRIgine (LAMICTAL) 25 MG tablet  2 times daily        09/10/22 2103              Gwyneth Sprout, MD 09/10/22 2103

## 2022-09-14 LAB — LAMOTRIGINE LEVEL: Lamotrigine Lvl: 4.4 ug/mL (ref 2.0–20.0)

## 2022-09-16 ENCOUNTER — Encounter: Payer: Self-pay | Admitting: Neurology

## 2022-09-16 ENCOUNTER — Ambulatory Visit (INDEPENDENT_AMBULATORY_CARE_PROVIDER_SITE_OTHER): Payer: Medicare Other | Admitting: Neurology

## 2022-09-16 VITALS — BP 123/78 | HR 68 | Wt 281.0 lb

## 2022-09-16 DIAGNOSIS — G40909 Epilepsy, unspecified, not intractable, without status epilepticus: Secondary | ICD-10-CM | POA: Diagnosis not present

## 2022-09-16 NOTE — Progress Notes (Signed)
NEUROLOGY CONSULTATION NOTE  Erica Griffin MRN: [id number] DOB: Mar 31, 1950  Referring provider: Dr. Derwood Kaplan Primary care provider: Dr. Blenda Mounts  Reason for consult:  seizures  Dear Dr Erica Griffin:  Thank you for your kind referral of Rod Griffin for consultation of the above symptoms. Although her history is well known to you, please allow me to reiterate it for the purpose of our medical record. The patient was accompanied to the clinic by SNF staff Pam who has known her for 3 years who also provides collateral information. Records and images were personally reviewed where available.   HISTORY OF PRESENT ILLNESS: This is a pleasant 72 year old left-handed woman with a history of cognitive communication deficit, asthma, seizures, presenting to establish care. Records from [facility] Neurology were reviewed, her last visit was in 2020. She was seen for tremors and seizures. She states seizures started when she was a baby. She has been on Primidone for many years. Notes from 06/2018 Neurology visit at [facility] indicate that she was having staring spells in 2020 and was started on Depakote with good response. Staff reported staring and unresponsiveness with whole body still lasting 5 minutes. She had been living in a group home at that time. She has been at [facility] for the past 3 years, Elita Quick has not witnessed the seizures. She was in the ER on 09/01/22 for seizure, nurse reported she was sitting on her wheelchair with staring/unresponsiveness for 5 minutes and was on Lamotrigine for seizures. On 09/10/22, she had 3 seizures. Per report, she had 9 in 2 weeks, which is unusual for her. She was noted to stare to the left, followed by speech even more difficult to understand. Lamotrigine level on 100mg  BID was 4.4. She had one more on 7/1 and Lamotrigine was increased to 150mg  BID. She continues on Primidone 175mg  TID (taking 50mg  + 1/2 tablet 250mg  TID). She denies any side effects  on medications. She denies any olfactory/gustatory hallucinations, focal numbness/tingling/weakness, myoclonic jerks. She denies any headaches, dizziness, dysphagia, neck/back pain, bowel/bladder dysfunction. No falls. She is wheelchair-bound, she needs a lift and cannot stand on her own. Sleep is good.   Head CT without contrast 08/2022 no acute changes.   PAST MEDICAL HISTORY: Past Medical History:  Diagnosis Date   Allergic rhinitis    Asthma    Cognitive communication deficit    Dysphagia    Esophageal stricture    Seizures (HCC)     PAST SURGICAL HISTORY: Past Surgical History:  Procedure Laterality Date   BIOPSY  07/09/2020   Procedure: BIOPSY;  Surgeon: Kathi Der, MD;  Location: [facility];  Service: Gastroenterology;;   CHOLECYSTECTOMY     ESOPHAGEAL DILATION  07/09/2020   Procedure: ESOPHAGEAL DILATION;  Surgeon: Kathi Der, MD;  Location: [facility];  Service: Gastroenterology;;   ESOPHAGOGASTRODUODENOSCOPY (EGD) WITH PROPOFOL N/A 04/25/2020   Procedure: ESOPHAGOGASTRODUODENOSCOPY (EGD) WITH PROPOFOL;  Surgeon: Kathi Der, MD;  Location: [facility];  Service: Gastroenterology;  Laterality: N/A;   ESOPHAGOGASTRODUODENOSCOPY (EGD) WITH PROPOFOL N/A 07/09/2020   Procedure: ESOPHAGOGASTRODUODENOSCOPY (EGD) WITH PROPOFOL;  Surgeon: Kathi Der, MD;  Location: [facility];  Service: Gastroenterology;  Laterality: N/A;   ESOPHAGOGASTRODUODENOSCOPY (EGD) WITH PROPOFOL N/A 07/21/2022   Procedure: ESOPHAGOGASTRODUODENOSCOPY (EGD) WITH PROPOFOL;  Surgeon: Kathi Der, MD;  Location: [facility];  Service: Gastroenterology;  Laterality: N/A;   FOREIGN BODY REMOVAL  04/25/2020   Procedure: FOREIGN BODY REMOVAL;  Surgeon: Kathi Der, MD;  Location: [facility];  Service: Gastroenterology;;    MEDICATIONS: Current Outpatient Medications on File Prior to Visit  Medication Sig Dispense Refill   acetaminophen (TYLENOL) 325 MG tablet Take 650 mg  by mouth 2 (two) times daily.     acetaminophen (TYLENOL) 500 MG tablet Take 1,000 mg by mouth every 8 (eight) hours as needed for mild pain.     albuterol (VENTOLIN HFA) 108 (90 Base) MCG/ACT inhaler Inhale 2 puffs into the lungs 2 (two) times daily. 8 g 11   ATIVAN 2 MG/ML injection SMARTSIG:0.25 Milliliter(s) IM Daily PRN     atorvastatin (LIPITOR) 10 MG tablet Take 10 mg by mouth at bedtime.     azelastine (ASTELIN) 0.1 % nasal spray Place 1 spray into both nostrils 2 (two) times daily.     busPIRone (BUSPAR) 10 MG tablet Take 10 mg by mouth 2 (two) times daily.     Cholecalciferol (VITAMIN D3) 1.25 MG (50000 UT) TABS Take 1.25 mg by mouth See admin instructions. Every 14 days (2 weeks) on Monday     diclofenac Sodium (VOLTAREN ARTHRITIS PAIN) 1 % GEL Apply 3 g topically in the morning, at noon, and at bedtime. Apply to knees     diphenhydrAMINE (BENADRYL) 12.5 MG chewable tablet Chew 12.5 mg by mouth at bedtime.     escitalopram (LEXAPRO) 10 MG tablet Take 10 mg by mouth in the morning.     fluticasone (FLONASE) 50 MCG/ACT nasal spray Place 1 spray into both nostrils daily.     guaiFENesin (MUCINEX) 600 MG 12 hr tablet Take 600 mg by mouth 2 (two) times daily.     ipratropium-albuterol (DUONEB) 0.5-2.5 (3) MG/3ML SOLN Take 3 mLs by nebulization every 6 (six) hours. 360 mL    lamoTRIgine (LAMICTAL) 150 MG tablet Take 150 mg by mouth 2 (two) times daily.     latanoprost (XALATAN) 0.005 % ophthalmic solution Place 2 drops into both eyes at bedtime.     lidocaine (LIDODERM) 5 % Place 1 patch onto the skin daily. Remove & Discard patch within 12 hours or as directed by MD  TO BILATERAL THIGH     loperamide (IMODIUM A-D) 2 MG tablet Take 2 mg by mouth 2 (two) times daily as needed for diarrhea or loose stools.     LUMIGAN 0.01 % SOLN Place 1 drop into both eyes daily.     Melatonin 10 MG TABS Take 10 mg by mouth at bedtime.     montelukast (SINGULAIR) 10 MG tablet Take 10 mg by mouth at bedtime.      nystatin ointment (MYCOSTATIN) Apply 1 Application topically 2 (two) times daily.     oxybutynin (DITROPAN-XL) 5 MG 24 hr tablet Take 5 mg by mouth daily.     pantoprazole (PROTONIX) 40 MG tablet Take 1 tablet (40 mg total) by mouth 2 (two) times daily. 60 tablet 5   polyvinyl alcohol (LIQUIFILM TEARS) 1.4 % ophthalmic solution Place 1 drop into both eyes 4 (four) times daily.     primidone (MYSOLINE) 250 MG tablet Take 125 mg by mouth 3 (three) times daily.     primidone (MYSOLINE) 50 MG tablet Take 50 mg by mouth 3 (three) times daily.     Skin Protectants, Misc. (MINERIN CREME) CREA Apply 1 Application topically daily. Apply Minerin Cream to bilateral lower extremities daily for dry cracked feet.     spironolactone (ALDACTONE) 25 MG tablet Take 25 mg by mouth daily.     sucralfate (CARAFATE) 1 GM/10ML suspension Take 10 mLs (  1 g total) by mouth 2 (two) times daily for 28 days. 420 mL 0   timolol (TIMOPTIC) 0.5 % ophthalmic solution Place 2 drops into both eyes daily.     traZODone (DESYREL) 50 MG tablet Take 50 mg by mouth at bedtime.     VALTOCO 5 MG DOSE 5 MG/0.1ML LIQD Place into both nostrils.     Vitamins A & D (A+D PREVENT) OINT ointment Apply 1 Application topically in the morning and at bedtime. Apply A+D ointment in between buttocks twice a day     potassium chloride SA (KLOR-CON M) 20 MEQ tablet Take 2 tablets (40 mEq total) by mouth daily for 3 days.     No current facility-administered medications on file prior to visit.    ALLERGIES: Allergies  Allergen Reactions   Codeine Nausea And Vomiting    FAMILY HISTORY: Family History  Problem Relation Age of Onset   Asthma Maternal Aunt     SOCIAL HISTORY: Social History   Socioeconomic History   Marital status: Divorced    Spouse name: Not on file   Number of children: Not on file   Years of education: Not on file   Highest education level: Not on file  Occupational History   Not on file  Tobacco Use   Smoking  status: Never   Smokeless tobacco: Never  Vaping Use   Vaping status: Never Used  Substance and Sexual Activity   Alcohol use: Never   Drug use: Never   Sexual activity: Not Currently  Other Topics Concern   Not on file  Social History Narrative   Left handed    Lives at [facility]   Social Determinants of Health   Financial Resource Strain: Not on file  Food Insecurity: Not on file  Transportation Needs: Not on file  Physical Activity: Not on file  Stress: Not on file  Social Connections: Not on file  Intimate Partner Violence: Not on file     PHYSICAL EXAM: Vitals:   09/16/22 1302  BP: 123/78  Pulse: 68  SpO2: 96%   General: No acute distress Head:  Normocephalic/atraumatic Skin/Extremities: No rash, no edema Neurological Exam: Mental status: alert and oriented to person, place, and time, moderate dysarthria, Fund of knowledge is appropriate.  Recent and remote memory are intact, 3/3 delayed recall.  Attention and concentration are normal, able to give days of week backwards (cannot spell WORLD backwards). Able to name and repeat. Cranial nerves: CN I: not tested CN II: pupils equal, round, visual fields intact CN III, IV, VI:  full range of motion, no nystagmus, no ptosis CN V: facial sensation intact CN VII: upper and lower face symmetric CN VIII: hearing intact to conversation Bulk & Tone: normal, no fasciculations. Motor: 5/5 throughout with no pronator drift. Sensation: intact to cold.  No extinction to double simultaneous stimulation.  Deep Tendon Reflexes: +2 throughout Cerebellar: no incoordination on finger to nose testing Gait: not tested, wheelchair-bound Tremor: none   IMPRESSION: This is a pleasant 72 year old left-handed woman with a history of cognitive communication deficit, asthma, seizures, presenting to establish care. Etiology of seizures unclear. She has had a recent increase in seizures with staring/unresponsive episodes, last seizure  7/1 when Lamotrigine was increased 150mg  BID. Continue on current dose, may increase to 200mg  BID if needed. She is also on Primidone 175mg  TID. MRI brain with and without contrast and 1-hour EEG will be ordered for characterization. She does not drive. Follow-up in 3 months,  call for any changes.    Thank you for allowing me to participate in the care of this patient. Please do not hesitate to call for any questions or concerns.   Patrcia Dolly, M.D.  CC: Dr. Rhunette Griffin, Dr. Harrison Mons

## 2022-09-16 NOTE — Patient Instructions (Signed)
Good to meet you.  Continue Lamotrigine 150mg  twice a day and Primidone 175mg  three times a day  2. Schedule MRI brain with and without contrast  3. Schedule 1-hour EEG  4. Follow-up in 3 months, call for any changes   Seizure Precautions: 1. If medication has been prescribed for you to prevent seizures, take it exactly as directed.  Do not stop taking the medicine without talking to your doctor first, even if you have not had a seizure in a long time.   2. Avoid activities in which a seizure would cause danger to yourself or to others.  Don't operate dangerous machinery, swim alone, or climb in high or dangerous places, such as on ladders, roofs, or girders.  Do not drive unless your doctor says you may.  3. If you have any warning that you may have a seizure, lay down in a safe place where you can't hurt yourself.    4.  No driving for 6 months from last seizure, as per [facility].   Please refer to the following [place] on the [employer]'s website for more information: http://www.epilepsyfoundation.org/answerplace/Social/driving/drivingu.cfm   5.  Maintain good sleep hygiene. Avoid alcohol  6.  Contact your doctor if you have any problems that may be related to the medicine you are taking.  7.  Call 911 and bring the patient back to the ED if:        A.  The seizure lasts longer than 5 minutes.       B.  The patient doesn't awaken shortly after the seizure  C.  The patient has new problems such as difficulty seeing, speaking or moving  D.  The patient was injured during the seizure  E.  The patient has a temperature over 102 F (39C)  F.  The patient vomited and now is having trouble breathing

## 2022-09-24 ENCOUNTER — Other Ambulatory Visit: Payer: Medicare Other

## 2022-09-24 DIAGNOSIS — G40909 Epilepsy, unspecified, not intractable, without status epilepticus: Secondary | ICD-10-CM

## 2022-09-25 ENCOUNTER — Telehealth: Payer: Self-pay | Admitting: Neurology

## 2022-09-25 NOTE — Telephone Encounter (Signed)
AVS faxed to [phone]

## 2022-09-25 NOTE — Telephone Encounter (Signed)
Left message with after hour service on 09-24-22 at 5:05 pm   Caller states that she is from [facility] she needs to know what happen with the patient appt

## 2022-09-30 ENCOUNTER — Ambulatory Visit: Payer: Medicare Other | Admitting: Neurology

## 2022-10-07 ENCOUNTER — Ambulatory Visit (HOSPITAL_COMMUNITY)
Admission: RE | Admit: 2022-10-07 | Discharge: 2022-10-07 | Disposition: A | Discharge status: Home / Self Care | Payer: Medicare Other | Source: Ambulatory Visit | Attending: Neurology | Admitting: Neurology

## 2022-10-07 DIAGNOSIS — G40909 Epilepsy, unspecified, not intractable, without status epilepticus: Secondary | ICD-10-CM | POA: Diagnosis not present

## 2022-10-07 MED ORDER — GADOBUTROL 1 MMOL/ML IV SOLN
10.0000 mL | Freq: Once | INTRAVENOUS | Status: AC | PRN
  Administered 2022-10-07: 10 mL via INTRAVENOUS

## 2022-10-09 ENCOUNTER — Other Ambulatory Visit: Payer: Self-pay | Admitting: Gastroenterology

## 2022-10-13 ENCOUNTER — Encounter (HOSPITAL_COMMUNITY): Payer: Self-pay | Admitting: Gastroenterology

## 2022-10-13 NOTE — Progress Notes (Signed)
Preop instructions for: Erica Griffin    Date of Birth:      [birth year]                Date of Procedure:  10/20/22  Procedure:     Upper Endoscopy Surgeon: [name] Facility contact:   [facility] Phone:  [phone]           RN contact name/phone#:    Yvonna Alanis        and Fax #: [phone]   Transportation contact phone#: Facility transportation  Time to arrive at [facility]: 0645 am   Report to: Admitting - Go through main entrance of hospital and tell front desk you are having a procedure done, and they will show how to get to admitting dept.   Do not eat solid food past midnight the night before your procedure.(To include any tube feedings-must be discontinued)  May have the following until 0400 am day of procedure  CLEAR LIQUID DIET Water Black Coffee (sugar ok, NO MILK/CREAM OR CREAMERS)  Tea (sugar ok, NO MILK/CREAM OR CREAMERS) regular and decaf                             Plain Jell-O (NO RED)                                           Fruit ices (not with fruit pulp, NO RED)                                     Popsicles (NO RED)                                                                  Juice: apple, WHITE grape, WHITE cranberry Sports drinks like Gatorade (NO RED)   Take these morning medications only with sips of water.(or give through gastrostomy or feeding tube):   Buspar, Lamictal, Mysoline, Valtoco, Lexapro, protonix, Ditropan  Note: No Insulin or Diabetic meds should be given or taken the morning of the procedure!   Please send day of procedure: current med list and meds last taken that day, confirm nothing by mouth status from what time, Patient Demographic info( to include DNR status, problem list, allergies)   Bring Insurance card and ID, can shower & use deodorant but nothing else on skin. Leave all jewelry and other valuables at place where living ( no metal or rings to be worn) Any questions prior to procedure call pre surg  nurse Lyla Son [phone]  Any questions DAY OF procedure, call ENDO [phone]   Sent from :Lyla Son, RN-[facility]   Phone:[phone] Fax:[phone]

## 2022-10-17 IMAGING — CR DG CHEST 2V
2 series · 2 of 2 positions shown · non-contrast
Comparison: 05/10/2020

CLINICAL DATA: Choking at lunch today, vomited, history of choking
with EGD scheduled for 07/09/2020

EXAM:
CHEST - 2 VIEW

[x chest ap]
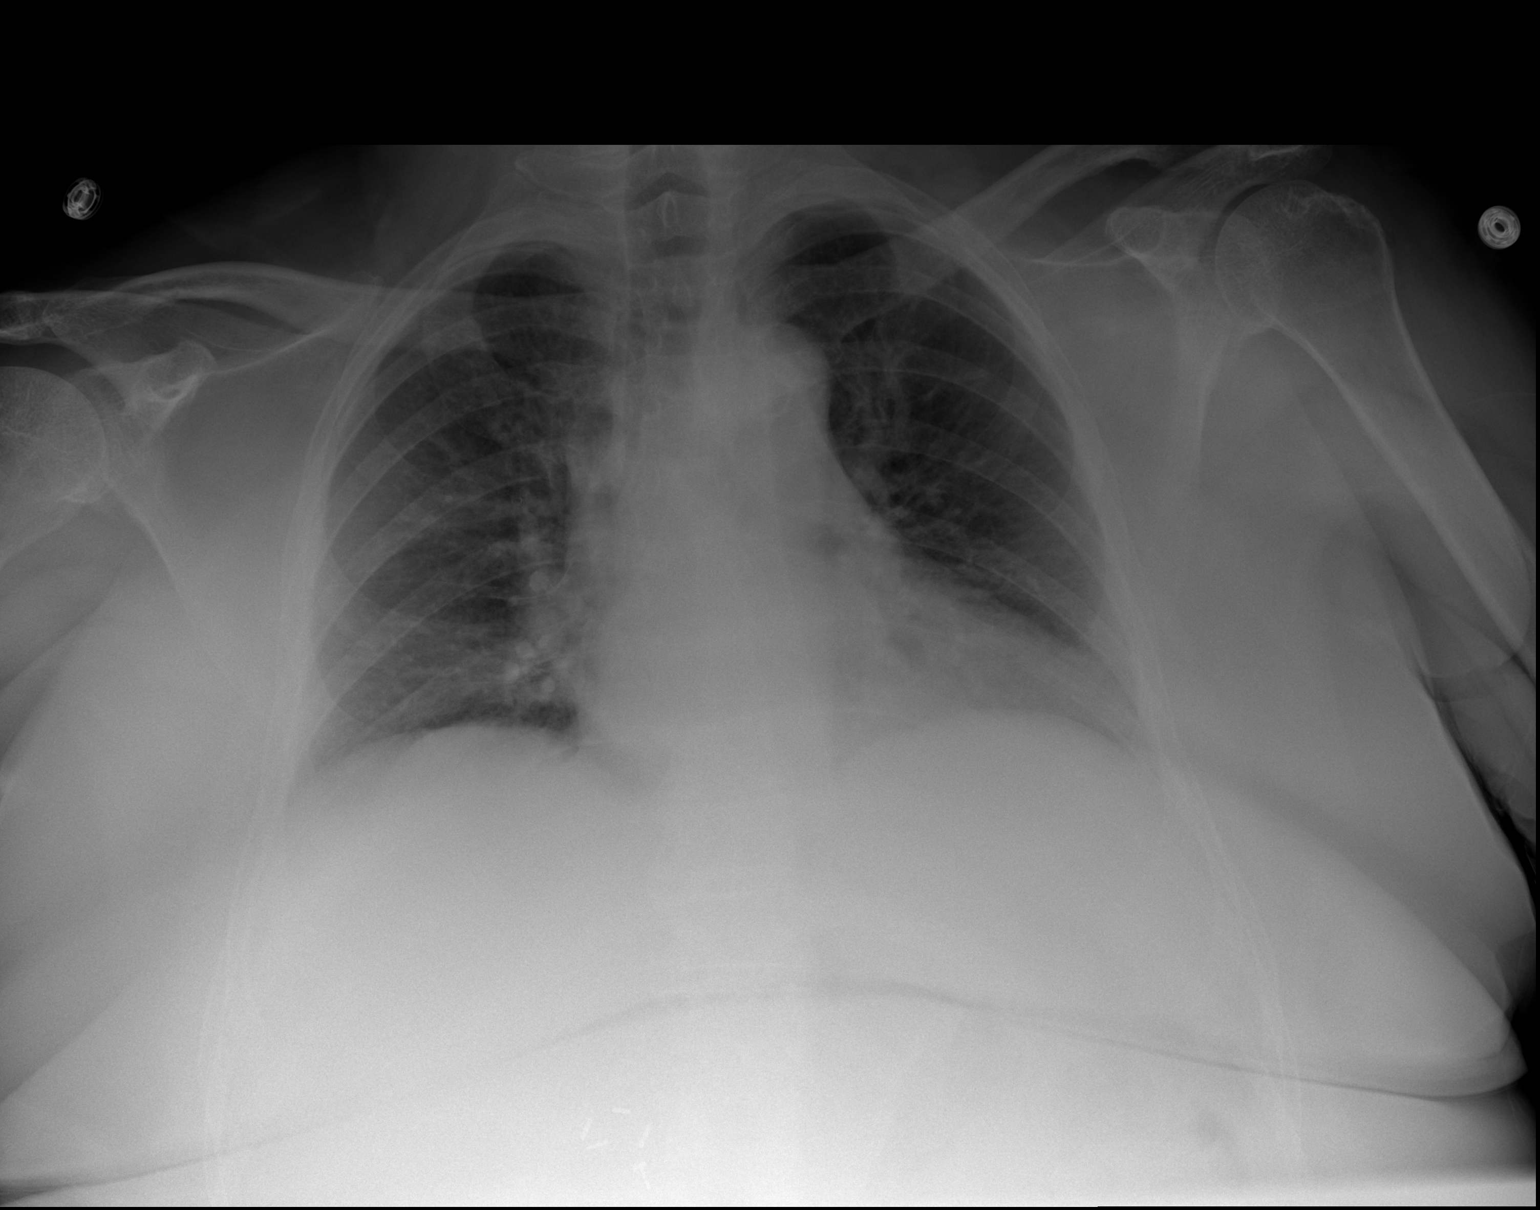

[w chest lat]
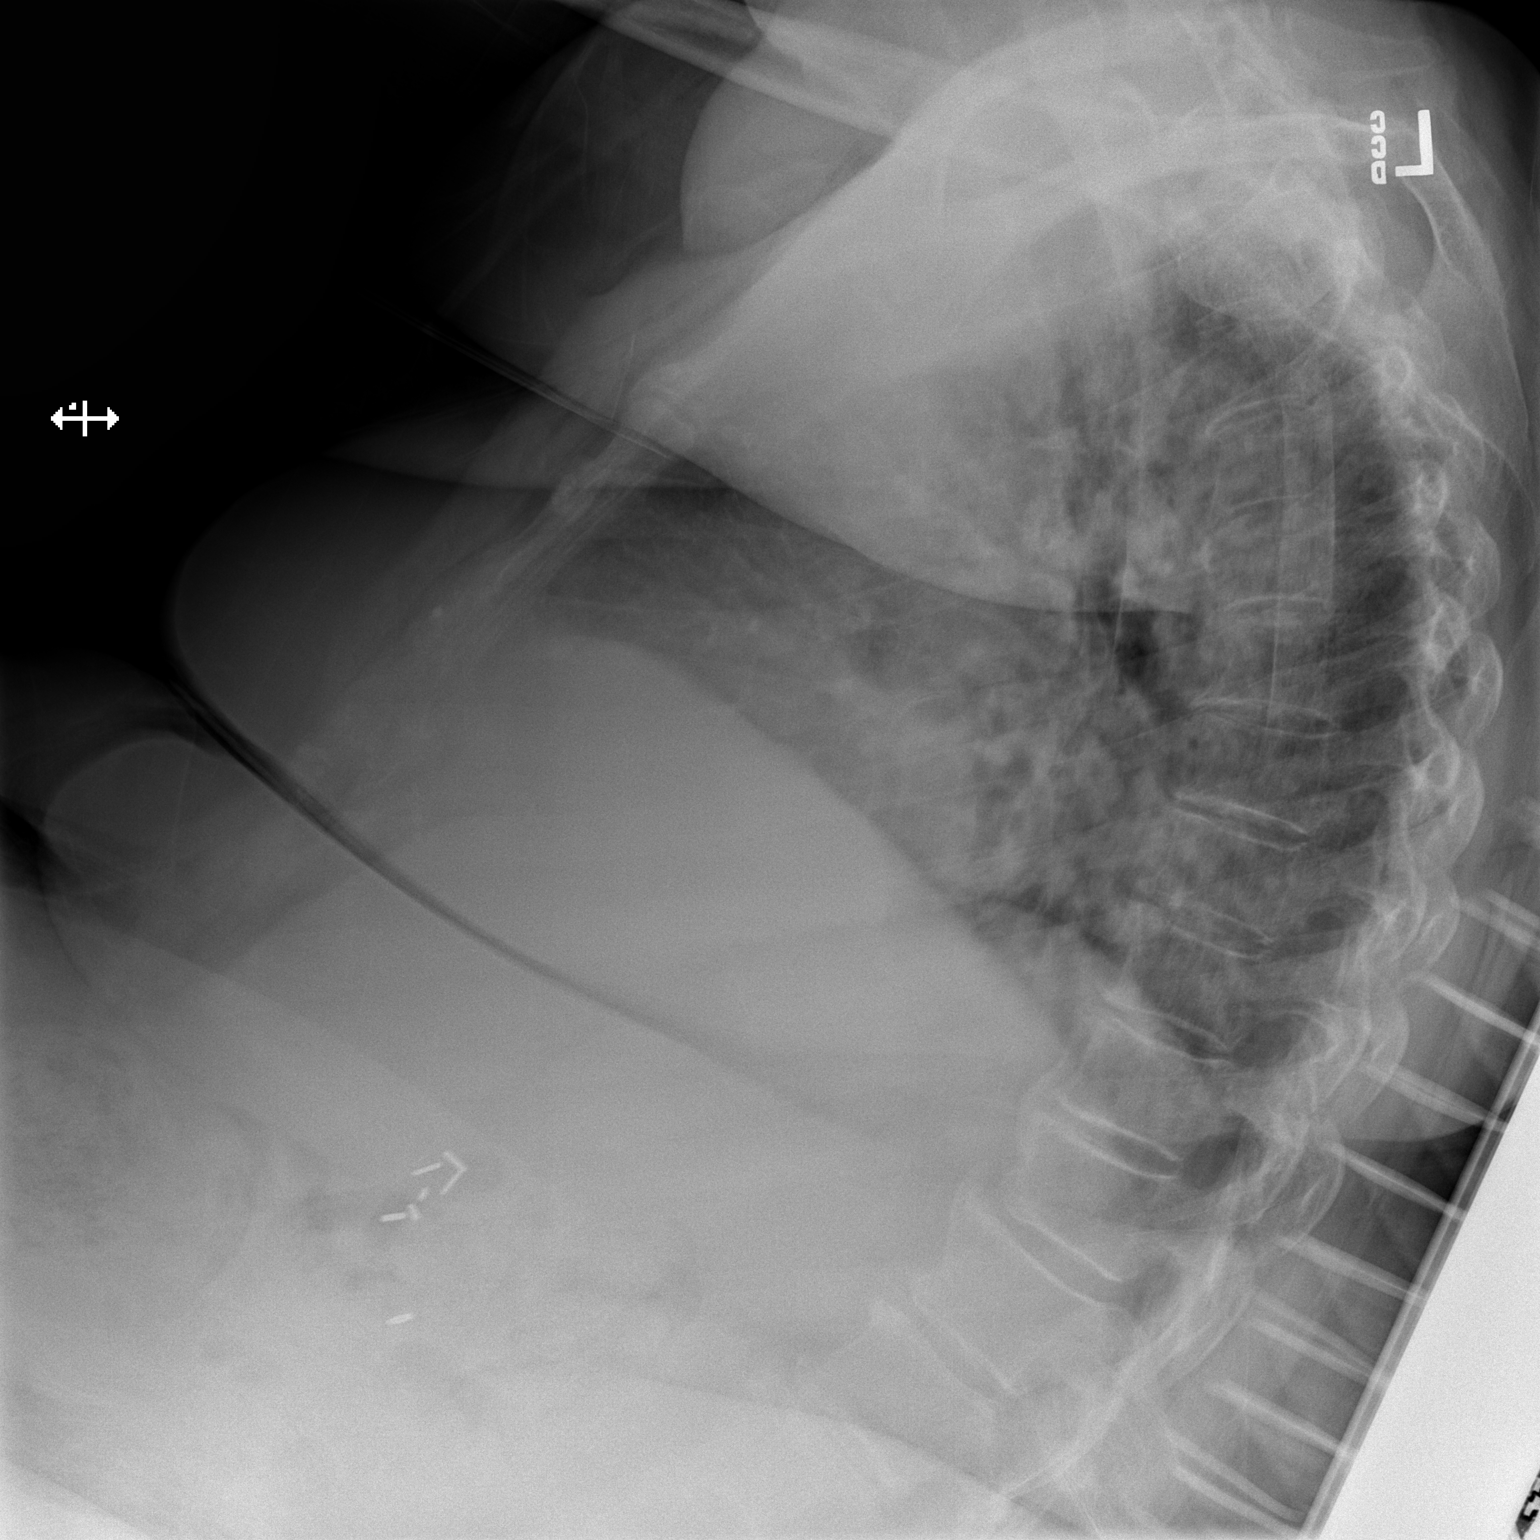

[2 of 2 positions shown; findings below may reference images not displayed]

FINDINGS: Enlargement of cardiac silhouette with pulmonary vascular
congestion.

Mediastinal contours normal.

Decreased lung volumes with minimal bibasilar atelectasis.

Remaining lungs clear.

No pleural effusion or pneumothorax.
IMPRESSION: Minimal bibasilar atelectasis.

## 2022-10-20 ENCOUNTER — Ambulatory Visit (HOSPITAL_COMMUNITY): Payer: Medicare Other | Admitting: Certified Registered Nurse Anesthetist

## 2022-10-20 ENCOUNTER — Ambulatory Visit (HOSPITAL_BASED_OUTPATIENT_CLINIC_OR_DEPARTMENT_OTHER): Payer: Medicare Other | Admitting: Certified Registered Nurse Anesthetist

## 2022-10-20 ENCOUNTER — Other Ambulatory Visit: Payer: Self-pay

## 2022-10-20 ENCOUNTER — Ambulatory Visit (HOSPITAL_COMMUNITY)
Admission: RE | Admit: 2022-10-20 | Discharge: 2022-10-20 | Disposition: A | Discharge status: Nursing Home (Includes ICF) | Payer: Medicare Other | Attending: Gastroenterology | Admitting: Gastroenterology

## 2022-10-20 ENCOUNTER — Encounter (HOSPITAL_COMMUNITY)
Admission: RE | Disposition: A | Discharge status: Nursing Home (Includes ICF) | Payer: Self-pay | Source: Home / Self Care | Attending: Gastroenterology

## 2022-10-20 DIAGNOSIS — K2210 Ulcer of esophagus without bleeding: Secondary | ICD-10-CM | POA: Diagnosis not present

## 2022-10-20 DIAGNOSIS — K449 Diaphragmatic hernia without obstruction or gangrene: Secondary | ICD-10-CM

## 2022-10-20 DIAGNOSIS — R1310 Dysphagia, unspecified: Secondary | ICD-10-CM | POA: Insufficient documentation

## 2022-10-20 DIAGNOSIS — K317 Polyp of stomach and duodenum: Secondary | ICD-10-CM | POA: Insufficient documentation

## 2022-10-20 DIAGNOSIS — K219 Gastro-esophageal reflux disease without esophagitis: Secondary | ICD-10-CM | POA: Diagnosis not present

## 2022-10-20 DIAGNOSIS — Z6841 Body Mass Index (BMI) 40.0 and over, adult: Secondary | ICD-10-CM | POA: Insufficient documentation

## 2022-10-20 DIAGNOSIS — J45909 Unspecified asthma, uncomplicated: Secondary | ICD-10-CM | POA: Insufficient documentation

## 2022-10-20 DIAGNOSIS — K222 Esophageal obstruction: Secondary | ICD-10-CM

## 2022-10-20 DIAGNOSIS — K3189 Other diseases of stomach and duodenum: Secondary | ICD-10-CM | POA: Diagnosis not present

## 2022-10-20 HISTORY — PX: BIOPSY: SHX5522

## 2022-10-20 HISTORY — PX: BALLOON DILATION: SHX5330

## 2022-10-20 HISTORY — PX: ESOPHAGOGASTRODUODENOSCOPY (EGD) WITH PROPOFOL: SHX5813

## 2022-10-20 SURGERY — ESOPHAGOGASTRODUODENOSCOPY (EGD) WITH PROPOFOL
Anesthesia: Monitor Anesthesia Care

## 2022-10-20 MED ORDER — LIDOCAINE HCL 1 % IJ SOLN
INTRAMUSCULAR | Status: AC
  Filled 2022-10-20: qty 20

## 2022-10-20 MED ORDER — LACTATED RINGERS IV SOLN: INTRAVENOUS | Status: DC | PRN

## 2022-10-20 MED ORDER — ONDANSETRON HCL 4 MG/2ML IJ SOLN
INTRAMUSCULAR | Status: DC | PRN
  Administered 2022-10-20: 4 mg via INTRAVENOUS

## 2022-10-20 MED ORDER — LIDOCAINE 2% (20 MG/ML) 5 ML SYRINGE
INTRAMUSCULAR | Status: DC | PRN
  Administered 2022-10-20: 100 mg via INTRAVENOUS

## 2022-10-20 MED ORDER — PANTOPRAZOLE SODIUM 40 MG PO TBEC: 40.0000 mg | DELAYED_RELEASE_TABLET | Freq: Every day | ORAL | 5 refills | Status: AC

## 2022-10-20 MED ORDER — PROPOFOL 10 MG/ML IV BOLUS
INTRAVENOUS | Status: DC | PRN
  Administered 2022-10-20: 20 mg via INTRAVENOUS
  Administered 2022-10-20: 30 mg via INTRAVENOUS

## 2022-10-20 MED ORDER — PROPOFOL 500 MG/50ML IV EMUL
INTRAVENOUS | Status: AC
  Filled 2022-10-20: qty 100

## 2022-10-20 MED ORDER — PROPOFOL 500 MG/50ML IV EMUL
INTRAVENOUS | Status: DC | PRN
  Administered 2022-10-20: 125 ug/kg/min via INTRAVENOUS

## 2022-10-20 SURGICAL SUPPLY — 15 items

## 2022-10-20 NOTE — H&P (Signed)
Primary Care Physician:  [name], MD Primary Gastroenterologist:  [name]  Reason for visit -outpatient EGD for possible dilation  HPI: Erica Griffin is a 72 y.o. female here for outpatient EGD with possible dilation as well as to document healing of esophageal ulcer. Patient with history of cognitive deficit, generalized muscle weakness and GERD .  Patient with history of intermittent dysphagia.  Several EGDs in the past which showed esophagitis and esophageal ulcers.  Last EGD was done in May 2024 which again showed distal esophageal ulceration and nonobstructive lower esophageal ring.  She was advised to stay on Protonix twice a day and sucralfate twice a day and repeat EGD was recommended in 3 months to document healing of ulcer as well as for dilation.     Similar presentation in 2022 and EGD at that time showed retained pill and small amount of retained food at GE junction.  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  Biopsies were negative for H. pylori.  Esophageal biopsy showed increased eosinophils at 20 per high-power field.  Past Medical History:  Diagnosis Date   Allergic rhinitis    Asthma    Cognitive communication deficit    Dysphagia    Esophageal stricture    Seizures (HCC)     Past Surgical History:  Procedure Laterality Date   BIOPSY  07/09/2020   Procedure: BIOPSY;  Surgeon: Kathi Der, MD;  Location: [facility];  Service: Gastroenterology;;   CHOLECYSTECTOMY     ESOPHAGEAL DILATION  07/09/2020   Procedure: ESOPHAGEAL DILATION;  Surgeon: Kathi Der, MD;  Location: [facility];  Service: Gastroenterology;;   ESOPHAGOGASTRODUODENOSCOPY (EGD) WITH PROPOFOL N/A 04/25/2020   Procedure: ESOPHAGOGASTRODUODENOSCOPY (EGD) WITH PROPOFOL;  Surgeon: Kathi Der, MD;  Location: [facility];  Service: Gastroenterology;  Laterality: N/A;   ESOPHAGOGASTRODUODENOSCOPY (EGD) WITH PROPOFOL N/A 07/09/2020    Procedure: ESOPHAGOGASTRODUODENOSCOPY (EGD) WITH PROPOFOL;  Surgeon: Kathi Der, MD;  Location: [facility];  Service: Gastroenterology;  Laterality: N/A;   ESOPHAGOGASTRODUODENOSCOPY (EGD) WITH PROPOFOL N/A 07/21/2022   Procedure: ESOPHAGOGASTRODUODENOSCOPY (EGD) WITH PROPOFOL;  Surgeon: Kathi Der, MD;  Location: [facility];  Service: Gastroenterology;  Laterality: N/A;   FOREIGN BODY REMOVAL  04/25/2020   Procedure: FOREIGN BODY REMOVAL;  Surgeon: Kathi Der, MD;  Location: [facility];  Service: Gastroenterology;;    Prior to Admission medications   Medication Sig Start Date End Date Taking? Authorizing Provider  acetaminophen (TYLENOL) 325 MG tablet Take 650 mg by mouth 2 (two) times daily.   Yes [provider]  acetaminophen (TYLENOL) 500 MG tablet Take 1,000 mg by mouth every 8 (eight) hours as needed for mild pain.   Yes [provider]  albuterol (VENTOLIN HFA) 108 (90 Base) MCG/ACT inhaler Inhale 2 puffs into the lungs 2 (two) times daily. 11/19/21  Yes Luciano Cutter, MD  atorvastatin (LIPITOR) 10 MG tablet Take 10 mg by mouth at bedtime. 04/22/22  Yes [provider]  azelastine (ASTELIN) 0.1 % nasal spray Place 1 spray into both nostrils 2 (two) times daily. 03/05/20  Yes [provider]  busPIRone (BUSPAR) 10 MG tablet Take 10 mg by mouth 2 (two) times daily. 04/02/20  Yes [provider]  Cholecalciferol (VITAMIN D3) 1.25 MG (50000 UT) TABS Take 1.25 mg by mouth See admin instructions. Every 14 days (2 weeks) on Monday   Yes [provider]  diclofenac Sodium (VOLTAREN ARTHRITIS PAIN) 1 % GEL Apply 3 g topically in the morning, at noon,  and at bedtime. Apply to knees   Yes [provider]  escitalopram (LEXAPRO) 10 MG tablet Take 10 mg by mouth in the morning. 12/18/18  Yes [provider]  fluticasone (FLONASE) 50 MCG/ACT nasal spray Place 1 spray into both nostrils daily. 11/06/13  Yes  [provider]  ipratropium-albuterol (DUONEB) 0.5-2.5 (3) MG/3ML SOLN Take 3 mLs by nebulization every 6 (six) hours. 07/23/22  Yes Burnadette Pop, MD  lamoTRIgine (LAMICTAL) 150 MG tablet Take 150 mg by mouth 2 (two) times daily.   Yes [provider]  latanoprost (XALATAN) 0.005 % ophthalmic solution Place 2 drops into both eyes at bedtime.   Yes [provider]  lidocaine (LIDODERM) 5 % Place 1 patch onto the skin daily. Remove & Discard patch within 12 hours or as directed by MD  TO BILATERAL THIGH   Yes [provider]  LUMIGAN 0.01 % SOLN Place 1 drop into both eyes daily. 04/04/20  Yes [provider]  Melatonin 10 MG TABS Take 10 mg by mouth at bedtime.   Yes [provider]  montelukast (SINGULAIR) 10 MG tablet Take 10 mg by mouth at bedtime. 04/04/20  Yes [provider]  nystatin ointment (MYCOSTATIN) Apply 1 Application topically 2 (two) times daily. 04/24/22  Yes [provider]  oxybutynin (DITROPAN-XL) 5 MG 24 hr tablet Take 5 mg by mouth daily. 04/16/20  Yes [provider]  pantoprazole (PROTONIX) 40 MG tablet Take 1 tablet (40 mg total) by mouth 2 (two) times daily. 05/22/22  Yes Luciano Cutter, MD  polyvinyl alcohol (LIQUIFILM TEARS) 1.4 % ophthalmic solution Place 1 drop into both eyes 4 (four) times daily.   Yes [provider]  primidone (MYSOLINE) 250 MG tablet Take 125 mg by mouth 3 (three) times daily. 04/08/20  Yes [provider]  primidone (MYSOLINE) 50 MG tablet Take 50 mg by mouth 3 (three) times daily. 09/10/22  Yes [provider]  spironolactone (ALDACTONE) 25 MG tablet Take 25 mg by mouth daily. 05/21/22  Yes [provider]  sucralfate (CARAFATE) 1 GM/10ML suspension Take 10 mLs (1 g total) by mouth 2 (two) times daily for 28 days. 07/23/22 10/20/22 Yes Burnadette Pop, MD  timolol (TIMOPTIC) 0.5 % ophthalmic solution Place 2 drops into both eyes daily.   Yes  [provider]  traZODone (DESYREL) 50 MG tablet Take 50 mg by mouth at bedtime. 09/04/22  Yes [provider]  ATIVAN 2 MG/ML injection SMARTSIG:0.25 Milliliter(s) IM Daily PRN 09/04/22   [provider]  diphenhydrAMINE (BENADRYL) 12.5 MG chewable tablet Chew 12.5 mg by mouth at bedtime.    [provider]  guaiFENesin (MUCINEX) 600 MG 12 hr tablet Take 600 mg by mouth 2 (two) times daily.    [provider]  loperamide (IMODIUM A-D) 2 MG tablet Take 2 mg by mouth 2 (two) times daily as needed for diarrhea or loose stools.    [provider]  potassium chloride SA (KLOR-CON M) 20 MEQ tablet Take 2 tablets (40 mEq total) by mouth daily for 3 days. 07/23/22 08/25/22  Burnadette Pop, MD  Skin Protectants, Misc. (MINERIN CREME) CREA Apply 1 Application topically daily. Apply Minerin Cream to bilateral lower extremities daily for dry cracked feet.    [provider]  VALTOCO 5 MG DOSE 5 MG/0.1ML LIQD Place into both nostrils. 09/10/22   [provider]  Vitamins A & D (A+D PREVENT) OINT ointment Apply 1 Application topically in the morning  and at bedtime. Apply A+D ointment in between buttocks twice a day    [provider]    Scheduled Meds: Continuous Infusions: PRN Meds:.  Allergies as of 10/09/2022 - Review Complete 09/16/2022  Allergen Reaction Noted   Codeine Nausea And Vomiting 02/06/2014    Family History  Problem Relation Age of Onset   Asthma Maternal Aunt     Social History   Socioeconomic History   Marital status: Divorced    Spouse name: Not on file   Number of children: Not on file   Years of education: Not on file   Highest education level: Not on file  Occupational History   Not on file  Tobacco Use   Smoking status: Never   Smokeless tobacco: Never  Vaping Use   Vaping status: Never Used  Substance and Sexual Activity   Alcohol use: Never   Drug use: Never   Sexual activity: Not  Currently  Other Topics Concern   Not on file  Social History Narrative   Left handed    Lives at [facility]   Social Determinants of Health   Financial Resource Strain: Not on file  Food Insecurity: Not on file  Transportation Needs: Not on file  Physical Activity: Not on file  Stress: Not on file  Social Connections: Not on file  Intimate Partner Violence: Not on file    Review of Systems: All negative except as stated above in HPI.  Physical Exam: Vital signs: Vitals:   10/20/22 0730  BP: (!) 159/71  Pulse: 71  Resp: 17  Temp: (!) 97.5 F (36.4 C)  SpO2: 97%     General:   Alert,  Well-developed, well-nourished, pleasant and cooperative in NAD Lungs: No visible respiratory distress Heart:  Regular rate and rhythm; no murmurs, clicks, rubs,  or gallops. Abdomen: Soft, nontender, nondistended, bowel sound present, no peritoneal signs Rectal:  Deferred  GI:  Lab Results: No results for input(s): "WBC", "HGB", "HCT", "PLT" in the last 72 hours. BMET No results for input(s): "NA", "K", "CL", "CO2", "GLUCOSE", "BUN", "CREATININE", "CALCIUM" in the last 72 hours. LFT No results for input(s): "PROT", "ALBUMIN", "AST", "ALT", "ALKPHOS", "BILITOT", "BILIDIR", "IBILI" in the last 72 hours. PT/INR No results for input(s): "LABPROT", "INR" in the last 72 hours.   Studies/Results: No results found.  Impression/Plan: -Lower esophageal ring -History of esophageal ulcers -Dysphagia  Recommendations --------------------------- -Proceed with EGD with possible dilation today.  Risks (bleeding, infection, bowel perforation that could require surgery, sedation-related changes in cardiopulmonary systems), benefits (identification and possible treatment of source of symptoms, exclusion of certain causes of symptoms), and alternatives (watchful waiting, radiographic imaging studies, empiric medical treatment)  were explained to patient/family in detail and patient wishes to  proceed.     LOS: 0 days   Kathi Der  MD, FACP 10/20/2022, 8:04 AM  Contact #  [phone]

## 2022-10-20 NOTE — Anesthesia Preprocedure Evaluation (Addendum)
Anesthesia Evaluation  Patient identified by MRN, date of birth, ID band Patient awake    Reviewed: Allergy & Precautions, NPO status , Patient's Chart, lab work & pertinent test results  Airway Mallampati: II  TM Distance: >3 FB Neck ROM: Full    Dental  (+) Edentulous Upper, Edentulous Lower, Dental Advisory Given   Pulmonary asthma    Pulmonary exam normal breath sounds clear to auscultation       Cardiovascular negative cardio ROS Normal cardiovascular exam Rhythm:Regular Rate:Normal     Neuro/Psych Seizures - (last seizure July 2023),  cognitive communication deficit  negative psych ROS   GI/Hepatic Neg liver ROS, PUD,,,  Endo/Other    Morbid obesity  Renal/GU negative Renal ROS  negative genitourinary   Musculoskeletal negative musculoskeletal ROS (+)    Abdominal   Peds  Hematology negative hematology ROS (+)   Anesthesia Other Findings Esophageal stricture  Reproductive/Obstetrics                             Anesthesia Physical Anesthesia Plan  ASA: 3  Anesthesia Plan: MAC   Post-op Pain Management:    Induction: Intravenous  PONV Risk Score and Plan: 2 and Propofol infusion and Treatment may vary due to age or medical condition  Airway Management Planned: Natural Airway  Additional Equipment:   Intra-op Plan:   Post-operative Plan:   Informed Consent: I have reviewed the patients History and Physical, chart, labs and discussed the procedure including the risks, benefits and alternatives for the proposed anesthesia with the patient or authorized representative who has indicated his/her understanding and acceptance.     Dental advisory given  Plan Discussed with: CRNA  Anesthesia Plan Comments:        Anesthesia Quick Evaluation

## 2022-10-20 NOTE — Transfer of Care (Signed)
Immediate Anesthesia Transfer of Care Note  Patient: ARJANAE DELMEDICO  Procedure(s) Performed: Procedure(s): ESOPHAGOGASTRODUODENOSCOPY (EGD) WITH PROPOFOL (N/A) BALLOON DILATION (N/A) BIOPSY  Patient Location: PACU  Anesthesia Type:MAC  Level of Consciousness: Patient easily awoken, sedated, comfortable, cooperative, following commands, responds to stimulation.   Airway & Oxygen Therapy: Patient spontaneously breathing, ventilating well, oxygen via simple oxygen mask.  Post-op Assessment: Report given to PACU RN, vital signs reviewed and stable, moving all extremities.   Post vital signs: Reviewed and stable.  Complications: No apparent anesthesia complications  Post vital signs: Reviewed and stable  Last Vitals:  Vitals Value Taken Time  BP 109/56 10/20/22 0908  Temp    Pulse 84 10/20/22 0908  Resp 16 10/20/22 0908  SpO2 100 10/20/22 0908    Last Pain:  Vitals:   10/20/22 0730  TempSrc: Temporal  PainSc: 0-No pain         Complications: No notable events documented.

## 2022-10-20 NOTE — Anesthesia Procedure Notes (Signed)
Procedure Name: MAC Date/Time: 10/20/2022 8:41 AM  Performed by: Ludwig Lean, CRNAPre-anesthesia Checklist: Patient identified, Emergency Drugs available, Suction available and Patient being monitored Patient Re-evaluated:Patient Re-evaluated prior to induction Oxygen Delivery Method: Simple face mask Preoxygenation: Pre-oxygenation with 100% oxygen Placement Confirmation: positive ETCO2 and breath sounds checked- equal and bilateral

## 2022-10-20 NOTE — Discharge Instructions (Signed)
No special instructions.

## 2022-10-20 NOTE — Op Note (Signed)
[facility] Patient Name: Erica Griffin Procedure Date: 10/20/2022 MRN: [id number] Attending MD: Kathi Der , MD, [id number] Date of Birth: [birth year] CSN: [id number] Age: 72 Admit Type: Outpatient Procedure:                Upper GI endoscopy Indications:              Dysphagia, Follow-up of esophageal ulcer Providers:                Kathi Der, MD, Marge Duncans, RN, Faustina                            Mbumina, Technician, Charlann Lange., RN Referring MD:              Medicines:                Sedation Administered by an Anesthesia Professional Complications:            No immediate complications. Estimated Blood Loss:     Estimated blood loss was minimal. Procedure:                Pre-Anesthesia Assessment:                           - Prior to the procedure, a History and Physical                            was performed, and patient medications and                            allergies were reviewed. The patient's tolerance of                            previous anesthesia was also reviewed. The risks                            and benefits of the procedure and the sedation                            options and risks were discussed with the patient.                            All questions were answered, and informed consent                            was obtained. Prior Anticoagulants: The patient has                            taken no anticoagulant or antiplatelet agents. ASA                            Grade Assessment: III - A patient with severe                            systemic disease. After reviewing the risks and  benefits, the patient was deemed in satisfactory                            condition to undergo the procedure.                           After obtaining informed consent, the endoscope was                            passed under direct vision. Throughout the                            procedure, the patient's blood  pressure, pulse, and                            oxygen saturations were monitored continuously. The                            [id number]-[id number] ([id number]) Olympus endoscope was introduced                            through the mouth, and advanced to the second part                            of duodenum. The upper GI endoscopy was                            accomplished without difficulty. The patient                            tolerated the procedure well. Scope In: Scope Out: Findings:      A non-obstructing Schatzki ring was found at the gastroesophageal       junction. A TTS dilator was passed through the scope. Dilation with a       15-16.5-18 mm balloon dilator was performed to 18 mm. The dilation site       was examined following endoscope reinsertion and showed no bleeding,       mucosal tear or perforation.      Normal mucosa was found in the entire esophagus. Biopsies were obtained       from the proximal and distal esophagus with cold forceps for histology       of suspected eosinophilic esophagitis.      A small hiatal hernia was present.      Striped mildly erythematous mucosa was found in the entire examined       stomach.      A few diminutive sessile polyps were found in the gastric body.      The cardia and gastric fundus were normal on retroflexion.      The duodenal bulb, first portion of the duodenum and second portion of       the duodenum were normal. Impression:               - Non-obstructing Schatzki ring. Dilated.                           - Normal  mucosa was found in the entire esophagus.                           - Small hiatal hernia.                           - Erythematous mucosa in the stomach.                           - A few gastric polyps.                           - Normal duodenal bulb, first portion of the                            duodenum and second portion of the duodenum.                           - Biopsies were taken with a cold forceps for                             evaluation of eosinophilic esophagitis. Moderate Sedation:      Moderate (conscious) sedation was personally administered by an       anesthesia professional. The following parameters were monitored: oxygen       saturation, heart rate, blood pressure, and response to care. Recommendation:           - Patient has a contact number available for                            emergencies. The signs and symptoms of potential                            delayed complications were discussed with the                            patient. Return to normal activities tomorrow.                            Written discharge instructions were provided to the                            patient.                           - Soft diet.                           - Continue present medications.                           - Await pathology results. Procedure Code(s):        --- Professional ---                           [phone], Esophagogastroduodenoscopy, flexible,  transoral; with transendoscopic balloon dilation of                            esophagus (less than 30 mm diameter)                           43239, 59, Esophagogastroduodenoscopy, flexible,                            transoral; with biopsy, single or multiple Diagnosis Code(s):        --- Professional ---                           K22.2, Esophageal obstruction                           K44.9, Diaphragmatic hernia without obstruction or                            gangrene                           K31.89, Other diseases of stomach and duodenum                           K31.7, Polyp of stomach and duodenum                           R13.10, Dysphagia, unspecified                           K22.10, Ulcer of esophagus without bleeding CPT copyright 2022 American Medical Association. All rights reserved. The codes documented in this report are preliminary and upon coder review may  be revised to meet current  compliance requirements. Kathi Der, MD Kathi Der, MD 10/20/2022 9:09:53 AM Number of Addenda: 0

## 2022-10-20 NOTE — Anesthesia Postprocedure Evaluation (Signed)
Anesthesia Post Note  Patient: NECHUMA FAUBION  Procedure(s) Performed: ESOPHAGOGASTRODUODENOSCOPY (EGD) WITH PROPOFOL BALLOON DILATION BIOPSY     Patient location during evaluation: PACU Anesthesia Type: MAC Level of consciousness: awake and alert Pain management: pain level controlled Vital Signs Assessment: post-procedure vital signs reviewed and stable Respiratory status: spontaneous breathing and respiratory function stable Cardiovascular status: blood pressure returned to baseline and stable Postop Assessment: no apparent nausea or vomiting Anesthetic complications: no  No notable events documented.  Last Vitals:  Vitals:   10/20/22 0910 10/20/22 0920  BP: 131/70 (!) 125/45  Pulse: 87 83  Resp: 17 15  Temp:    SpO2: 96% 97%    Last Pain:  Vitals:   10/20/22 0920  TempSrc:   PainSc: 0-No pain                 Climmie Cronce L Josip Merolla

## 2022-10-25 ENCOUNTER — Encounter (HOSPITAL_COMMUNITY): Payer: Self-pay | Admitting: Gastroenterology

## 2022-12-22 ENCOUNTER — Ambulatory Visit: Payer: Medicare Other | Admitting: Neurology

## 2023-02-10 ENCOUNTER — Encounter (HOSPITAL_BASED_OUTPATIENT_CLINIC_OR_DEPARTMENT_OTHER): Payer: Self-pay | Admitting: Pulmonary Disease

## 2023-02-10 ENCOUNTER — Ambulatory Visit (HOSPITAL_BASED_OUTPATIENT_CLINIC_OR_DEPARTMENT_OTHER): Payer: Medicare Other | Admitting: Pulmonary Disease

## 2023-02-10 VITALS — BP 124/74 | HR 75 | Resp 18

## 2023-02-10 DIAGNOSIS — R053 Chronic cough: Secondary | ICD-10-CM | POA: Diagnosis not present

## 2023-02-10 NOTE — Progress Notes (Signed)
Subjective:   PATIENT ID: Erica Griffin GENDER: female DOB: 11-04-1950, MRN: [id number]   HPI  Chief Complaint  Patient presents with   Follow-up    Cough has been so-so. Coughed all night last night    Reason for Visit: Chronic cough  [name] is a 72 year old female never smoker with GERD, hx of esophageal ulcer and Schatzki ring s/p dilation, hx COVID-19 2020 who presents for follow-up  Synopsis: She reports chronic cough usually during eating. Since February she has had three episodes of ED visit involving choking. Every time she eats she coughs. Even when not eating she will have coughing spells. Not associated with swallowing saliva. Cough is non productive. She recently underwent an upper EGD and found with non-obstructing Schatzki ring was found at the gastroesophageal junction that was dilated on 07/09/20. Her caregiver reports that it seems worse than her procedure two days ago. Sometimes wheezing. Denies allergies. No nasal congestion, sinus issue. She does report a history of asthma that she uses a rescue inhaler twice a day. Denies fevers, chills.  11/04/20 - Caretaker present and provided additional history Since our last visit she was unable to complete PFTs due to difficulty understanding directions. She has been compliant with her albuterol and protonix. She uses Albuterol twice a day and feels this has helped her cough significantly. Last episode of coughing was last week. She follows aspiration precautions.  11/19/21 - Caregiver present and provides additional history. She reports dry cough that usually improves with albuterol. Reflux worsens with her cough. However she ran out of albuterol and requested from our office however it has been over one year since our last visit so schedule visit.   05/22/22 - No caregiver present today. History limited due to intellectual disability. Compliant albuterol twice a day, protonix twice a day, montelukast and nasal sprays. Cough  overall unchanged. Continues to have breakthrough reflux. She reports she recently had the flu but this is improved. Previously had wheezing but this has resolved.   02/10/23 - Caregiver present. She does overall well however last night had difficulty clearing her throat and wheezing. She was able to produce sputum. She is still using her albuterol twice a day. Compliant reflux twice a day, montelukast and nasal sprays. Possibly aspirated overnight because she does eat in her bed at night.  Social History: Never smoker   Past Medical History:  Diagnosis Date   Allergic rhinitis    Asthma    Cognitive communication deficit    Dysphagia    Esophageal stricture    Seizures (HCC)      Allergies  Allergen Reactions   Codeine Nausea And Vomiting     Outpatient Medications Prior to Visit  Medication Sig Dispense Refill   acetaminophen (TYLENOL) 325 MG tablet Take 650 mg by mouth 2 (two) times daily.     acetaminophen (TYLENOL) 500 MG tablet Take 1,000 mg by mouth every 8 (eight) hours as needed for mild pain.     albuterol (VENTOLIN HFA) 108 (90 Base) MCG/ACT inhaler Inhale 2 puffs into the lungs 2 (two) times daily. 8 g 11   ATIVAN 2 MG/ML injection SMARTSIG:0.25 Milliliter(s) IM Daily PRN     atorvastatin (LIPITOR) 10 MG tablet Take 10 mg by mouth at bedtime.     azelastine (ASTELIN) 0.1 % nasal spray Place 1 spray into both nostrils 2 (two) times daily.     busPIRone (BUSPAR) 10 MG tablet Take 10 mg by mouth 2 (  two) times daily.     Cholecalciferol (VITAMIN D3) 1.25 MG (50000 UT) TABS Take 1.25 mg by mouth See admin instructions. Every 14 days (2 weeks) on Monday     diclofenac Sodium (VOLTAREN ARTHRITIS PAIN) 1 % GEL Apply 3 g topically in the morning, at noon, and at bedtime. Apply to knees     diphenhydrAMINE (BENADRYL) 12.5 MG chewable tablet Chew 12.5 mg by mouth at bedtime.     escitalopram (LEXAPRO) 10 MG tablet Take 10 mg by mouth in the morning.     fluticasone (FLONASE) 50  MCG/ACT nasal spray Place 1 spray into both nostrils daily.     guaiFENesin (MUCINEX) 600 MG 12 hr tablet Take 600 mg by mouth 2 (two) times daily.     ipratropium-albuterol (DUONEB) 0.5-2.5 (3) MG/3ML SOLN Take 3 mLs by nebulization every 6 (six) hours. 360 mL    lamoTRIgine (LAMICTAL) 150 MG tablet Take 150 mg by mouth 2 (two) times daily.     latanoprost (XALATAN) 0.005 % ophthalmic solution Place 2 drops into both eyes at bedtime.     lidocaine (LIDODERM) 5 % Place 1 patch onto the skin daily. Remove & Discard patch within 12 hours or as directed by MD  TO BILATERAL THIGH     loperamide (IMODIUM A-D) 2 MG tablet Take 2 mg by mouth 2 (two) times daily as needed for diarrhea or loose stools.     LUMIGAN 0.01 % SOLN Place 1 drop into both eyes daily.     Melatonin 10 MG TABS Take 10 mg by mouth at bedtime.     montelukast (SINGULAIR) 10 MG tablet Take 10 mg by mouth at bedtime.     nystatin ointment (MYCOSTATIN) Apply 1 Application topically 2 (two) times daily.     oxybutynin (DITROPAN-XL) 5 MG 24 hr tablet Take 5 mg by mouth daily.     pantoprazole (PROTONIX) 40 MG tablet Take 1 tablet (40 mg total) by mouth daily. 60 tablet 5   polyvinyl alcohol (LIQUIFILM TEARS) 1.4 % ophthalmic solution Place 1 drop into both eyes 4 (four) times daily.     primidone (MYSOLINE) 250 MG tablet Take 125 mg by mouth 3 (three) times daily.     primidone (MYSOLINE) 50 MG tablet Take 50 mg by mouth 3 (three) times daily.     Skin Protectants, Misc. (MINERIN CREME) CREA Apply 1 Application topically daily. Apply Minerin Cream to bilateral lower extremities daily for dry cracked feet.     spironolactone (ALDACTONE) 25 MG tablet Take 25 mg by mouth daily.     timolol (TIMOPTIC) 0.5 % ophthalmic solution Place 2 drops into both eyes daily.     traZODone (DESYREL) 50 MG tablet Take 50 mg by mouth at bedtime.     VALTOCO 5 MG DOSE 5 MG/0.1ML LIQD Place into both nostrils.     Vitamins A & D (A+D PREVENT) OINT ointment  Apply 1 Application topically in the morning and at bedtime. Apply A+D ointment in between buttocks twice a day     potassium chloride SA (KLOR-CON M) 20 MEQ tablet Take 2 tablets (40 mEq total) by mouth daily for 3 days.     sucralfate (CARAFATE) 1 GM/10ML suspension Take 10 mLs (1 g total) by mouth 2 (two) times daily for 28 days. 420 mL 0   No facility-administered medications prior to visit.    Review of Systems  Constitutional:  Negative for chills, diaphoresis, fever, malaise/fatigue and weight loss.  HENT:  Negative  for congestion.   Respiratory:  Positive for sputum production and wheezing. Negative for cough, hemoptysis and shortness of breath.   Cardiovascular:  Negative for chest pain, palpitations and leg swelling.     Objective:   Vitals:   02/10/23 1348  BP: 124/74  Pulse: 75  Resp: 18  SpO2: 100%   SpO2: 100 %  Physical Exam: General: Well-appearing, no acute distress HENT: [place], AT Eyes: EOMI, no scleral icterus Respiratory: Clear to auscultation bilaterally.  No crackles, wheezing or rales Cardiovascular: RRR, -M/R/G, no JVD Extremities:-Edema,-tenderness Neuro: AAO x4, CNII-XII grossly intact Psych: Normal mood, normal affect  Data Reviewed:  Imaging: CXR 06/20/20 - Small lung volumes with bibasilar atelectasis. No effusion, edema or infiltrate  PFT: None on file  Labs: CBC    Component Value Date/Time   WBC 8.8 09/01/2022 1656   RBC 4.13 09/01/2022 1656   HGB 12.8 09/01/2022 1656   HCT 39.8 09/01/2022 1656   PLT 322 09/01/2022 1656   MCV 96.4 09/01/2022 1656   MCH 31.0 09/01/2022 1656   MCHC 32.2 09/01/2022 1656   RDW 14.8 09/01/2022 1656   LYMPHSABS 2.0 09/01/2022 1656   MONOABS 0.8 09/01/2022 1656   EOSABS 0.3 09/01/2022 1656   BASOSABS 0.1 09/01/2022 1656   Absolute eos 07/11/20 100     Assessment & Plan:   Discussion: 72 year old female wheelchair bound female with intellectual disability who presents for follow-up Cough overall  well controlled on current SABA regimen with possible episodes of reflux.   Chronic cough, suspect secondary to reflux +/- aspiration -  Triggered by reflux and aspiration Unable to complete PFTs --CONTINUE Albuterol TWO  puffs twice a day --CONTINUE protonix 2 times a day  --Aspiration precautions  Health Maintenance Immunization History  Administered Date(s) Administered   Moderna SARS-COV2 Booster Vaccination 02/01/2020   Moderna Sars-Covid-2 Vaccination 03/13/2019, 04/10/2019     No orders of the defined types were placed in this encounter.  No orders of the defined types were placed in this encounter.   Return in about 1 year (around 02/10/2024).  I have spent a total time of 22-minutes on the day of the appointment including chart review, data review, collecting history, coordinating care and discussing medical diagnosis and plan with the patient/family. Past medical history, allergies, medications were reviewed. Pertinent imaging, labs and tests included in this note have been reviewed and interpreted independently by me.  Yanixan Mellinger Mechele Collin, MD [facility] 02/10/2023 2:21 PM  Office Number [phone]

## 2023-02-10 NOTE — Patient Instructions (Signed)
Chronic cough, suspect secondary to reflux +/- aspiration -  Triggered by reflux and aspiration Unable to complete PFTs --CONTINUE Albuterol TWO  puffs twice a day --CONTINUE protonix 2 times a day  --Aspiration precautions

## 2023-07-27 ENCOUNTER — Encounter: Payer: Self-pay | Admitting: Neurology

## 2023-07-27 ENCOUNTER — Ambulatory Visit (INDEPENDENT_AMBULATORY_CARE_PROVIDER_SITE_OTHER): Payer: Medicare Other | Admitting: Neurology

## 2023-07-27 VITALS — BP 86/58 | HR 89 | Wt 316.2 lb

## 2023-07-27 DIAGNOSIS — G40909 Epilepsy, unspecified, not intractable, without status epilepticus: Secondary | ICD-10-CM | POA: Diagnosis not present

## 2023-07-27 NOTE — Progress Notes (Signed)
 NEUROLOGY FOLLOW UP OFFICE NOTE  Erica Griffin [id number] Feb 14, 1951  HISTORY OF PRESENT ILLNESS: I had the pleasure of seeing Erica Griffin in follow-up in the neurology clinic on 07/27/2023.  The patient was last seen 10 months ago for seizures. She is accompanied by SNF staff who helps supplement the history today.  Records and images were personally reviewed where available. I personally reviewed MRI brain with and without contrast done 09/2022 which did not show any acute changes, hippocampi symmetric with no abnormal signal or enhancement. There were mildly low-lying cerebellar tonsils. EEG was not done.  Since her last visit, they deny any seizures since July 2024 on Lamotrigine  150mg  BID, no side effects. She denies any staring/unresponsive episodes, gaps in time, olfactory/gustatory hallucinations, focal numbness/tingling/weakness, myoclonic jerks. She has rare headaches, no dizziness, vision changes, no falls. She is wheelchair-bound. Sometimes she does not sleep well. Mood is good.   History on Initial Assessment 09/16/2022: This is a pleasant 73 year old left-handed woman with a history of cognitive communication deficit, asthma, seizures, presenting to establish care. Records from [facility] were reviewed, her last visit was in 2020. She was seen for tremors and seizures. She states seizures started when she was a baby. She has been on Primidone  for many years. Notes from 06/2018 Neurology visit at [facility] indicate that she was having staring spells in 2020 and was started on Depakote with good response. Staff reported staring and unresponsiveness with whole body still lasting 5 minutes. She had been living in a group home at that time. She has been at [facility] for the past 3 years, Oralee Billow has not witnessed the seizures. She was in the ER on 09/01/22 for seizure, nurse reported she was sitting on her wheelchair with staring/unresponsiveness for 5 minutes and was on Lamotrigine   for seizures. On 09/10/22, she had 3 seizures. Per report, she had 9 in 2 weeks, which is unusual for her. She was noted to stare to the left, followed by speech even more difficult to understand. Lamotrigine  level on 100mg  BID was 4.4. She had one more on 7/1 and Lamotrigine  was increased to 150mg  BID. She continues on Primidone  175mg  TID (taking 50mg  + 1/2 tablet 250mg  TID). She denies any side effects on medications. She denies any olfactory/gustatory hallucinations, focal numbness/tingling/weakness, myoclonic jerks. She denies any headaches, dizziness, dysphagia, neck/back pain, bowel/bladder dysfunction. No falls. She is wheelchair-bound, she needs a lift and cannot stand on her own. Sleep is good.   Head CT without contrast 08/2022 no acute changes.   PAST MEDICAL HISTORY: Past Medical History:  Diagnosis Date   Allergic rhinitis    Asthma    Cognitive communication deficit    Dysphagia    Esophageal stricture    Seizures (HCC)     MEDICATIONS: Current Outpatient Medications on File Prior to Visit  Medication Sig Dispense Refill   acetaminophen  (TYLENOL ) 325 MG tablet Take 650 mg by mouth 2 (two) times daily.     acetaminophen  (TYLENOL ) 500 MG tablet Take 1,000 mg by mouth every 8 (eight) hours as needed for mild pain.     albuterol  (VENTOLIN  HFA) 108 (90 Base) MCG/ACT inhaler Inhale 2 puffs into the lungs 2 (two) times daily. 8 g 11   ATIVAN 2 MG/ML injection SMARTSIG:0.25 Milliliter(s) IM Daily PRN     atorvastatin  (LIPITOR) 10 MG tablet Take 10 mg by mouth at bedtime.     azelastine (ASTELIN) 0.1 % nasal spray Place 1 spray  into both nostrils 2 (two) times daily.     busPIRone  (BUSPAR ) 10 MG tablet Take 10 mg by mouth 2 (two) times daily.     Cholecalciferol (VITAMIN D3) 1.25 MG (50000 UT) TABS Take 1.25 mg by mouth See admin instructions. Every 14 days (2 weeks) on Monday     diclofenac Sodium (VOLTAREN ARTHRITIS PAIN) 1 % GEL Apply 3 g topically in the morning, at noon, and at  bedtime. Apply to knees     diphenhydrAMINE (BENADRYL) 12.5 MG chewable tablet Chew 12.5 mg by mouth at bedtime.     escitalopram  (LEXAPRO ) 10 MG tablet Take 10 mg by mouth in the morning.     fluticasone (FLONASE) 50 MCG/ACT nasal spray Place 1 spray into both nostrils daily.     guaiFENesin (MUCINEX) 600 MG 12 hr tablet Take 600 mg by mouth 2 (two) times daily.     ipratropium-albuterol  (DUONEB) 0.5-2.5 (3) MG/3ML SOLN Take 3 mLs by nebulization every 6 (six) hours. 360 mL    lamoTRIgine  (LAMICTAL ) 150 MG tablet Take 150 mg by mouth 2 (two) times daily.     latanoprost (XALATAN) 0.005 % ophthalmic solution Place 2 drops into both eyes at bedtime.     lidocaine  (LIDODERM ) 5 % Place 1 patch onto the skin daily. Remove & Discard patch within 12 hours or as directed by MD  TO BILATERAL THIGH     loperamide  (IMODIUM  A-D) 2 MG tablet Take 2 mg by mouth 2 (two) times daily as needed for diarrhea or loose stools.     LUMIGAN 0.01 % SOLN Place 1 drop into both eyes daily.     Melatonin 10 MG TABS Take 10 mg by mouth at bedtime.     montelukast (SINGULAIR) 10 MG tablet Take 10 mg by mouth at bedtime.     nystatin ointment (MYCOSTATIN) Apply 1 Application topically 2 (two) times daily.     oxybutynin (DITROPAN-XL) 5 MG 24 hr tablet Take 5 mg by mouth daily.     pantoprazole  (PROTONIX ) 40 MG tablet Take 1 tablet (40 mg total) by mouth daily. 60 tablet 5   polyvinyl alcohol (LIQUIFILM TEARS) 1.4 % ophthalmic solution Place 1 drop into both eyes 4 (four) times daily.     potassium chloride  SA (KLOR-CON  M) 20 MEQ tablet Take 2 tablets (40 mEq total) by mouth daily for 3 days.     primidone  (MYSOLINE ) 250 MG tablet Take 125 mg by mouth 3 (three) times daily.     primidone  (MYSOLINE ) 50 MG tablet Take 50 mg by mouth 3 (three) times daily.     Skin Protectants, Misc. (MINERIN CREME) CREA Apply 1 Application topically daily. Apply Minerin Cream to bilateral lower extremities daily for dry cracked feet.      spironolactone  (ALDACTONE ) 25 MG tablet Take 25 mg by mouth daily.     sucralfate  (CARAFATE ) 1 GM/10ML suspension Take 10 mLs (1 g total) by mouth 2 (two) times daily for 28 days. 420 mL 0   timolol (TIMOPTIC) 0.5 % ophthalmic solution Place 2 drops into both eyes daily.     traZODone (DESYREL) 50 MG tablet Take 50 mg by mouth at bedtime.     VALTOCO 5 MG DOSE 5 MG/0.1ML LIQD Place into both nostrils.     Vitamins A & D (A+D PREVENT) OINT ointment Apply 1 Application topically in the morning and at bedtime. Apply A+D ointment in between buttocks twice a day     No current facility-administered medications on file  prior to visit.    ALLERGIES: Allergies  Allergen Reactions   Codeine Nausea And Vomiting    FAMILY HISTORY: Family History  Problem Relation Age of Onset   Asthma Maternal Aunt     SOCIAL HISTORY: Social History   Socioeconomic History   Marital status: Divorced    Spouse name: Not on file   Number of children: Not on file   Years of education: Not on file   Highest education level: Not on file  Occupational History   Not on file  Tobacco Use   Smoking status: Never   Smokeless tobacco: Never  Vaping Use   Vaping status: Never Used  Substance and Sexual Activity   Alcohol use: Never   Drug use: Never   Sexual activity: Not Currently  Other Topics Concern   Not on file  Social History Narrative   Left handed    Lives at [facility]   Social Drivers of Health   Financial Resource Strain: Not on file  Food Insecurity: Not on file  Transportation Needs: Not on file  Physical Activity: Not on file  Stress: Not on file  Social Connections: Not on file  Intimate Partner Violence: Not on file     PHYSICAL EXAM: Vitals:   07/27/23 1353  BP: (!) 86/58  Pulse: 89  SpO2: 94%   General: No acute distress, sitting on wheelchair Head:  Normocephalic/atraumatic Skin/Extremities: No rash, no edema Neurological Exam: alert and awake. No aphasia, moderate  dysarthria (chronic). Fund of knowledge is appropriate.   Attention and concentration are normal.   Cranial nerves: Pupils equal, round. Extraocular movements intact with no nystagmus. Visual fields full.  No facial asymmetry.  Motor: Bulk and tone normal, muscle strength 5/5 throughout with no pronator drift.   Finger to nose testing intact.  Gait not tested. No tremors today.   IMPRESSION: This is a pleasant 73 yo LH woman with a history of cognitive communication deficit, asthma, and seizures. Etiology of seizures unclear. MRI brain no acute changes. She had an increase in seizures with staring/unresponsive episodes, none since July 2024 when Lamotrigine  was increased to 150mg  BID. She is also on Primidone  175mg  BID for tremor, none in the office today. Continue current regimen. Continue 24/7 care. Follow-up in 1 year, call for any changes.    Thank you for allowing me to participate in her care.  Please do not hesitate to call for any questions or concerns.    Rayfield Cairo, M.D.   CC: Dr. Tiny Forest

## 2023-07-27 NOTE — Patient Instructions (Signed)
Good to see you doing well. Continue all your medications. Follow-up in 1 year, call for any changes. ° ° °Seizure Precautions: °1. If medication has been prescribed for you to prevent seizures, take it exactly as directed.  Do not stop taking the medicine without talking to your doctor first, even if you have not had a seizure in a long time.  ° °2. Avoid activities in which a seizure would cause danger to yourself or to others.  Don't operate dangerous machinery, swim alone, or climb in high or dangerous places, such as on ladders, roofs, or girders.  Do not drive unless your doctor says you may. ° °3. If you have any warning that you may have a seizure, lay down in a safe place where you can't hurt yourself.   ° °4.  No driving for 6 months from last seizure, as per [place] state law.   Please refer to the following link on the [employer]'s website for more information: http://www.epilepsyfoundation.org/answerplace/Social/driving/drivingu.cfm  ° °5.  Maintain good sleep hygiene. Avoid alcohol. ° °6.  Contact your doctor if you have any problems that may be related to the medicine you are taking. ° °7.  Call 911 and bring the patient back to the ED if: °      ° A.  The seizure lasts longer than 5 minutes.      ° B.  The patient doesn't awaken shortly after the seizure ° C.  The patient has new problems such as difficulty seeing, speaking or moving ° D.  The patient was injured during the seizure ° E.  The patient has a temperature over 102 F (39C) ° F.  The patient vomited and now is having trouble breathing °      ° °

## 2024-02-02 ENCOUNTER — Encounter: Payer: Self-pay | Admitting: Neurology

## 2024-02-21 ENCOUNTER — Ambulatory Visit (INDEPENDENT_AMBULATORY_CARE_PROVIDER_SITE_OTHER): Admitting: Pulmonary Disease

## 2024-02-21 ENCOUNTER — Encounter (HOSPITAL_BASED_OUTPATIENT_CLINIC_OR_DEPARTMENT_OTHER): Payer: Self-pay | Admitting: Pulmonary Disease

## 2024-02-21 VITALS — BP 137/70 | HR 84 | Ht 66.0 in | Wt 301.0 lb

## 2024-02-21 DIAGNOSIS — R051 Acute cough: Secondary | ICD-10-CM

## 2024-02-21 DIAGNOSIS — R0981 Nasal congestion: Secondary | ICD-10-CM

## 2024-02-21 DIAGNOSIS — R053 Chronic cough: Secondary | ICD-10-CM

## 2024-02-21 MED ORDER — IPRATROPIUM BROMIDE 0.03 % NA SOLN: 2.0000 | Freq: Two times a day (BID) | NASAL | 1 refills | Status: AC

## 2024-02-21 NOTE — Progress Notes (Signed)
 Subjective:   PATIENT ID: Erica Griffin GENDER: female DOB: January 30, 1951, MRN: [id number]   HPI  Chief Complaint  Patient presents with   Cough    Follow up      Reason for Visit: Chronic cough  Erica Griffin is a 73 year old female never smoker with GERD, hx of esophageal ulcer and Schatzki ring s/p dilation, hx COVID-19 2020 who presents for follow-up  Synopsis: She reports chronic cough usually during eating. Since February she has had three episodes of ED visit involving choking. Every time she eats she coughs. Even when not eating she will have coughing spells. Not associated with swallowing saliva. Cough is non productive. She recently underwent an upper EGD and found with non-obstructing Schatzki ring was found at the gastroesophageal junction that was dilated on 07/09/20. Her caregiver reports that it seems worse than her procedure two days ago. Sometimes wheezing. Denies allergies. No nasal congestion, sinus issue. She does report a history of asthma that she uses a rescue inhaler twice a day. Denies fevers, chills.  11/04/20 - Caretaker present and provided additional history Since our last visit she was unable to complete PFTs due to difficulty understanding directions. She has been compliant with her albuterol  and protonix . She uses Albuterol  twice a day and feels this has helped her cough significantly. Last episode of coughing was last week. She follows aspiration precautions.  11/19/21 - Caregiver present and provides additional history. She reports dry cough that usually improves with albuterol . Reflux worsens with her cough. However she ran out of albuterol  and requested from our office however it has been over one year since our last visit so schedule visit.   05/22/22 - No caregiver present today. History limited due to intellectual disability. Compliant albuterol  twice a day, protonix  twice a day, montelukast and nasal sprays. Cough overall unchanged. Continues to have  breakthrough reflux. She reports she recently had the flu but this is improved. Previously had wheezing but this has resolved.   02/10/23 - Caregiver present. She does overall well however last night had difficulty clearing her throat and wheezing. She was able to produce sputum. She is still using her albuterol  twice a day. Compliant reflux twice a day, montelukast and nasal sprays. Possibly aspirated overnight because she does eat in her bed at night.  02/21/24 Caregiver present and adds to history. She is having cough with wheezing nightly that began a few days ago. Associated with shortness of breath. Does not occur any other time of the day. Denies fever and chills. Has worsening nasal congestion. Flonase two sprays per nare nightly.   Social History: Never smoker   Past Medical History:  Diagnosis Date   Allergic rhinitis    Asthma    Cognitive communication deficit    Dysphagia    Esophageal stricture    Seizures (HCC)      Allergies  Allergen Reactions   Codeine Nausea And Vomiting     Outpatient Medications Prior to Visit  Medication Sig Dispense Refill   acetaminophen  (TYLENOL ) 325 MG tablet Take 650 mg by mouth 2 (two) times daily.     acetaminophen  (TYLENOL ) 500 MG tablet Take 1,000 mg by mouth every 8 (eight) hours as needed for mild pain.     albuterol  (VENTOLIN  HFA) 108 (90 Base) MCG/ACT inhaler Inhale 2 puffs into the lungs 2 (two) times daily. 8 g 11   atorvastatin  (LIPITOR) 10 MG tablet Take 10 mg by mouth at bedtime.  azelastine (ASTELIN) 0.1 % nasal spray Place 1 spray into both nostrils 2 (two) times daily.     busPIRone  (BUSPAR ) 10 MG tablet Take 10 mg by mouth 2 (two) times daily.     Cholecalciferol (VITAMIN D3) 1.25 MG (50000 UT) TABS Take 1.25 mg by mouth See admin instructions. Every 14 days (2 weeks) on Monday     clotrimazole (LOTRIMIN) 1 % external solution Apply 1 Application topically 2 (two) times daily.     diclofenac Sodium (VOLTAREN ARTHRITIS  PAIN) 1 % GEL Apply 3 g topically in the morning, at noon, and at bedtime. Apply to knees     escitalopram  (LEXAPRO ) 10 MG tablet Take 10 mg by mouth in the morning.     fluticasone (FLONASE) 50 MCG/ACT nasal spray Place 1 spray into both nostrils daily.     guaiFENesin (MUCINEX) 600 MG 12 hr tablet Take 600 mg by mouth 2 (two) times daily.     lamoTRIgine  (LAMICTAL ) 150 MG tablet Take 150 mg by mouth 2 (two) times daily.     latanoprost (XALATAN) 0.005 % ophthalmic solution Place 2 drops into both eyes at bedtime.     lidocaine  (LIDODERM ) 5 % Place 1 patch onto the skin daily. Remove & Discard patch within 12 hours or as directed by MD  TO BILATERAL THIGH     loperamide  (IMODIUM  A-D) 2 MG tablet Take 2 mg by mouth 2 (two) times daily as needed for diarrhea or loose stools.     losartan (COZAAR) 25 MG tablet Take 25 mg by mouth daily.     LUMIGAN 0.01 % SOLN Place 1 drop into both eyes daily.     Melatonin 10 MG TABS Take 10 mg by mouth at bedtime.     montelukast (SINGULAIR) 10 MG tablet Take 10 mg by mouth at bedtime.     oxybutynin (DITROPAN-XL) 5 MG 24 hr tablet Take 5 mg by mouth daily.     pantoprazole  (PROTONIX ) 40 MG tablet Take 1 tablet (40 mg total) by mouth daily. 60 tablet 5   polyvinyl alcohol (LIQUIFILM TEARS) 1.4 % ophthalmic solution Place 1 drop into both eyes 4 (four) times daily.     potassium chloride  SA (KLOR-CON  M) 20 MEQ tablet Take 2 tablets (40 mEq total) by mouth daily for 3 days.     primidone  (MYSOLINE ) 250 MG tablet Take 125 mg by mouth 3 (three) times daily.     primidone  (MYSOLINE ) 50 MG tablet Take 50 mg by mouth 3 (three) times daily.     sucralfate  (CARAFATE ) 1 GM/10ML suspension Take 10 mLs (1 g total) by mouth 2 (two) times daily for 28 days. 420 mL 0   timolol (TIMOPTIC) 0.5 % ophthalmic solution Place 2 drops into both eyes daily.     torsemide (DEMADEX) 20 MG tablet Take 20 mg by mouth 2 (two) times daily.     traZODone (DESYREL) 50 MG tablet Take 50 mg  by mouth at bedtime.     VALTOCO 5 MG DOSE 5 MG/0.1ML LIQD Place into both nostrils.     Vitamins A & D (A+D PREVENT) OINT ointment Apply 1 Application topically in the morning and at bedtime. Apply A+D ointment in between buttocks twice a day     No facility-administered medications prior to visit.    Review of Systems  Constitutional:  Negative for chills, diaphoresis, fever, malaise/fatigue and weight loss.  HENT:  Positive for congestion.   Respiratory:  Negative for cough, hemoptysis, sputum production,  shortness of breath and wheezing.   Cardiovascular:  Negative for chest pain, palpitations and leg swelling.     Objective:   Vitals:   02/21/24 1304  BP: 137/70  Pulse: 84  SpO2: 90%  Weight: (!) 301 lb (136.5 kg)  Height: 5' 6 (1.676 m)    SpO2: 90 %  Physical Exam: General: Well-appearing, no acute distress HENT: [place], AT, nasal congestion Eyes: EOMI, no scleral icterus Respiratory: Clear to auscultation bilaterally.  No crackles, wheezing or rales Cardiovascular: RRR, -M/R/G, no JVD Extremities:-Edema,-tenderness Neuro: AAO x4, CNII-XII grossly intact Psych: Normal mood, normal affect  Data Reviewed:  Imaging: CXR 06/20/20 - Small lung volumes with bibasilar atelectasis. No effusion, edema or infiltrate  PFT: None on file  Labs: CBC    Component Value Date/Time   WBC 8.8 09/01/2022 1656   RBC 4.13 09/01/2022 1656   HGB 12.8 09/01/2022 1656   HCT 39.8 09/01/2022 1656   PLT 322 09/01/2022 1656   MCV 96.4 09/01/2022 1656   MCH 31.0 09/01/2022 1656   MCHC 32.2 09/01/2022 1656   RDW 14.8 09/01/2022 1656   LYMPHSABS 2.0 09/01/2022 1656   MONOABS 0.8 09/01/2022 1656   EOSABS 0.3 09/01/2022 1656   BASOSABS 0.1 09/01/2022 1656   Absolute eos 07/11/20 100     Assessment & Plan:   Discussion: 73 year old female wheelchair bound female with intellectual disability who presents for follow-up. Cough worsened. Exacerbated by nasal congestion.  Chronic  cough, suspect secondary to reflux +/- aspiration -  Triggered by reflux and aspiration. Worsening cough likely due to nasal congestion. Unable to complete PFTs --CONTINUE Albuterol  TWO  puffs twice a day --CONTINUE protonix  2 times a day  --Aspiration precautions  Nasal congestion --STOP astelin  --STOP flonase --START atrovent  nasal spray two sprays per nare in the morning and evening --Once symptoms improve, ok to continue or go back to prior nasal sprays  Health Maintenance Immunization History  Administered Date(s) Administered   Moderna SARS-COV2 Booster Vaccination 02/01/2020   Moderna Sars-Covid-2 Vaccination 03/13/2019, 04/10/2019    No orders of the defined types were placed in this encounter.  Meds ordered this encounter  Medications   ipratropium (ATROVENT ) 0.03 % nasal spray    Sig: Place 2 sprays into both nostrils every 12 (twelve) hours.    Dispense:  30 mL    Refill:  1    Return in about 6 months (around 08/21/2024).  I have spent a total time of 30-minutes on the day of the appointment including chart review, data review, collecting history, coordinating care and discussing medical diagnosis and plan with the patient/family. Past medical history, allergies, medications were reviewed. Pertinent imaging, labs and tests included in this note have been reviewed and interpreted independently by me.  Erma Raiche Slater Staff, MD [facility] 02/21/2024 1:18 PM

## 2024-02-21 NOTE — Patient Instructions (Signed)
  Chronic cough, suspect secondary to reflux +/- aspiration -  Triggered by reflux and aspiration. Worsening cough likely due to nasal congestion. Unable to complete PFTs --CONTINUE Albuterol  TWO  puffs twice a day --CONTINUE protonix  2 times a day  --Aspiration precautions  Nasal congestion --STOP astelin  --STOP flonase --START atrovent  nasal spray two sprays per nare in the morning and evening --Once symptoms improve, ok to continue or go back to prior nasal sprays

## 2024-07-26 ENCOUNTER — Ambulatory Visit: Admitting: Neurology
